# Patient Record
Sex: Male | Born: 1980 | Race: Black or African American | Hispanic: No | Marital: Single | Smoking: Current every day smoker
Health system: Southern US, Community
[De-identification: ages and names within clinical notes are randomized; demographics above are authoritative.]

## PROBLEM LIST (undated history)

## (undated) DIAGNOSIS — I10 Essential (primary) hypertension: Secondary | ICD-10-CM

## (undated) DIAGNOSIS — K219 Gastro-esophageal reflux disease without esophagitis: Secondary | ICD-10-CM

## (undated) DIAGNOSIS — R599 Enlarged lymph nodes, unspecified: Secondary | ICD-10-CM

## (undated) DIAGNOSIS — R519 Headache, unspecified: Secondary | ICD-10-CM

## (undated) DIAGNOSIS — M542 Cervicalgia: Secondary | ICD-10-CM

## (undated) HISTORY — PX: OTHER SURGICAL HISTORY: SHX169

## (undated) HISTORY — DX: Cervicalgia: M54.2

## (undated) HISTORY — DX: Enlarged lymph nodes, unspecified: R59.9

## (undated) HISTORY — PX: SKIN LESION EXCISION: SHX2412

## (undated) HISTORY — DX: Headache, unspecified: R51.9

## (undated) HISTORY — DX: Essential (primary) hypertension: I10

---
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
## 2000-01-15 ENCOUNTER — Encounter: Payer: Self-pay | Admitting: General Practice

## 2000-01-15 ENCOUNTER — Encounter: Admission: RE | Admit: 2000-01-15 | Discharge: 2000-01-15 | Payer: Self-pay | Admitting: General Practice

## 2000-12-21 ENCOUNTER — Observation Stay (HOSPITAL_COMMUNITY): Admission: EM | Admit: 2000-12-21 | Discharge: 2000-12-22 | Payer: Self-pay

## 2004-04-24 ENCOUNTER — Emergency Department (HOSPITAL_COMMUNITY): Admission: EM | Admit: 2004-04-24 | Discharge: 2004-04-24 | Payer: Self-pay | Admitting: Emergency Medicine

## 2005-10-31 ENCOUNTER — Emergency Department (HOSPITAL_COMMUNITY): Admission: EM | Admit: 2005-10-31 | Discharge: 2005-10-31 | Payer: Self-pay | Admitting: Emergency Medicine

## 2006-01-27 ENCOUNTER — Emergency Department (HOSPITAL_COMMUNITY): Admission: EM | Admit: 2006-01-27 | Discharge: 2006-01-27 | Payer: Self-pay | Admitting: Family Medicine

## 2006-02-11 ENCOUNTER — Emergency Department (HOSPITAL_COMMUNITY): Admission: EM | Admit: 2006-02-11 | Discharge: 2006-02-11 | Payer: Self-pay | Admitting: Emergency Medicine

## 2012-06-16 ENCOUNTER — Encounter: Payer: Self-pay | Admitting: Gastroenterology

## 2012-06-16 ENCOUNTER — Encounter (HOSPITAL_COMMUNITY): Payer: Self-pay | Admitting: *Deleted

## 2012-06-16 ENCOUNTER — Emergency Department (INDEPENDENT_AMBULATORY_CARE_PROVIDER_SITE_OTHER)
Admission: EM | Admit: 2012-06-16 | Discharge: 2012-06-16 | Disposition: A | Discharge status: Home / Self Care | Payer: Self-pay | Source: Home / Self Care | Attending: Family Medicine | Admitting: Family Medicine

## 2012-06-16 DIAGNOSIS — R109 Unspecified abdominal pain: Secondary | ICD-10-CM

## 2012-06-16 DIAGNOSIS — K921 Melena: Secondary | ICD-10-CM

## 2012-06-16 LAB — POCT I-STAT, CHEM 8
BUN: 19 mg/dL (ref 6–23)
Calcium, Ion: 1.18 mmol/L (ref 1.12–1.23)
Chloride: 103 mEq/L (ref 96–112)
Creatinine, Ser: 1.3 mg/dL (ref 0.50–1.35)
Glucose, Bld: 86 mg/dL (ref 70–99)
HCT: 48 % (ref 39.0–52.0)
Hemoglobin: 16.3 g/dL (ref 13.0–17.0)
Potassium: 4.1 mEq/L (ref 3.5–5.1)
Sodium: 139 mEq/L (ref 135–145)
TCO2: 27 mmol/L (ref 0–100)

## 2012-06-16 LAB — OCCULT BLOOD, POC DEVICE: Fecal Occult Bld: POSITIVE

## 2012-06-16 MED ORDER — METHYLCELLULOSE (LAXATIVE) PO POWD: 1.0000 | Freq: Every day | ORAL | Status: DC

## 2012-06-16 MED ORDER — DICYCLOMINE HCL 20 MG PO TABS: 20.0000 mg | ORAL_TABLET | Freq: Two times a day (BID) | ORAL | Status: DC | PRN

## 2012-06-16 NOTE — ED Notes (Signed)
Pt  Reports  Rectal  Bleeding  Off  And  On  For  Several  Years  - he  Reports  He  Had    colonoscopys  When he  Was  incaretaed  In past  And  Was told  He  Had  hemmoriods          In past  He   Reports  Bleeding  At  Times  After bm   Both  Bright  And  Dark he  Stated     -  He    Reports some  Bubbling in his  Stomach at  Intervals  As  Well

## 2012-06-16 NOTE — ED Provider Notes (Signed)
History     CSN: [id number]  Arrival date & time 06/16/12  1131   First MD Initiated Contact with Patient 06/16/12 1322      Chief Complaint  Patient presents with  . Rectal Bleeding    (Consider location/radiation/quality/duration/timing/severity/associated sxs/prior treatment) Patient is a 31 y.o. male presenting with hematochezia. The history is provided by the patient.  Rectal Bleeding  Associated symptoms include abdominal pain and diarrhea. Pertinent negatives include no nausea, no rectal pain and no vomiting.  Blood in Stool: Patient presents for presents evaluation of blood in stool/ rectal bleeding. Patient has no associated straining with defecation.  the patient denies n/v/d, no symptoms of dehydration.  The patient has no known history of PUD, liver disease, cirrhosis, IBD, bleeding disorders and no history of taking NSAIDS/Coumadin. The patient reports ongoing problem, noted to be triggered by food intake.  There is not a history of rectal injury/denies anal intercourse. Patient has had similar episodes of rectal bleeding in the past.  Colonoscopy completed January 2013 while in [place] and was told he had hemorrhoids for which rectal suppositories and proctofoam was prescribed.   History reviewed. No pertinent past medical history.  No past surgical history on file.  Family History  Problem Relation Age of Onset  . Irritable bowel syndrome Sister     History  Substance Use Topics  . Smoking status: Not on file  . Smokeless tobacco: Not on file  . Alcohol Use:       Review of Systems  Constitutional: Negative.   Respiratory: Negative.   Cardiovascular: Negative.   Gastrointestinal: Positive for abdominal pain, diarrhea, blood in stool, hematochezia and anal bleeding. Negative for nausea, vomiting, constipation and rectal pain.  Genitourinary: Negative.     Allergies  Review of patient's allergies indicates no known allergies.  Home Medications    Current Outpatient Rx  Name Route Sig Dispense Refill  . DICYCLOMINE HCL 20 MG PO TABS Oral Take 1 tablet (20 mg total) by mouth 2 (two) times daily as needed. 30 tablet 1  . METHYLCELLULOSE (LAXATIVE) PO POWD Oral Take 1 packet by mouth daily. 30 tablet 2    BP 140/81  Pulse 78  Temp 98.6 F (37 C) (Oral)  Resp 18  SpO2 100%  Physical Exam  Nursing note and vitals reviewed. Constitutional: He is oriented to person, place, and time. Vital signs are normal. He appears well-developed and well-nourished. He is active and cooperative.  HENT:  Head: Normocephalic.  Mouth/Throat: Uvula is midline, oropharynx is clear and moist and mucous membranes are normal.  Eyes: Conjunctivae normal are normal. Pupils are equal, round, and reactive to light. No scleral icterus.  Neck: Trachea normal, normal range of motion and full passive range of motion without pain. Neck supple. No spinous process tenderness and no muscular tenderness present. No Brudzinski's sign noted.  Cardiovascular: Normal rate, regular rhythm, normal heart sounds, intact distal pulses and normal pulses.   Pulmonary/Chest: Effort normal and breath sounds normal.  Abdominal: Soft. Normal appearance and bowel sounds are normal. He exhibits no mass. There is no tenderness. There is no rebound and no guarding.  Genitourinary: Prostate normal. Rectal exam shows no external hemorrhoid, no internal hemorrhoid, no fissure, no mass, no tenderness and anal tone normal. Guaiac positive stool.  Lymphadenopathy:       Right: No inguinal adenopathy present.       Left: No inguinal adenopathy present.  Neurological: He is alert and oriented to person, place, and time.  No cranial nerve deficit or sensory deficit.  Skin: Skin is warm and dry.  Psychiatric: He has a normal mood and affect. His speech is normal and behavior is normal. Judgment and thought content normal. Cognition and memory are normal.    ED Course  Procedures (including  critical care time)   Labs Reviewed  OCCULT BLOOD, POC DEVICE  POCT I-STAT, CHEM 8   No results found.   1. Abdominal cramping   2. Blood in stool       MDM  Probable IBS.  Follow up with gastroenterologist for further evaluation and diagnostic studies.       Johnsie Kindred, NP 06/16/12 1428

## 2012-06-17 NOTE — ED Provider Notes (Signed)
Medical screening examination/treatment/procedure(s) were performed by non-physician practitioner and as supervising physician I was immediately available for consultation/collaboration.   [name],Ricardo [name]   Ricardo Crumbley Moreno-Coll, [name] 06/17/12 2128 

## 2012-07-09 ENCOUNTER — Ambulatory Visit (INDEPENDENT_AMBULATORY_CARE_PROVIDER_SITE_OTHER): Payer: Self-pay | Admitting: Gastroenterology

## 2012-07-09 ENCOUNTER — Encounter: Payer: Self-pay | Admitting: Gastroenterology

## 2012-07-09 VITALS — BP 124/82 | HR 67 | Ht 67.0 in | Wt 191.8 lb

## 2012-07-09 DIAGNOSIS — K5120 Ulcerative (chronic) proctitis without complications: Secondary | ICD-10-CM | POA: Insufficient documentation

## 2012-07-09 DIAGNOSIS — K589 Irritable bowel syndrome without diarrhea: Secondary | ICD-10-CM | POA: Insufficient documentation

## 2012-07-09 DIAGNOSIS — K921 Melena: Secondary | ICD-10-CM

## 2012-07-09 MED ORDER — MESALAMINE 1000 MG RE SUPP: 1000.0000 mg | Freq: Every day | RECTAL | Status: DC

## 2012-07-09 MED ORDER — DICYCLOMINE HCL 20 MG PO TABS: ORAL_TABLET | ORAL | Status: DC

## 2012-07-09 NOTE — Patient Instructions (Addendum)
You have been given information on Hemorrhoid/ IBS/ rectal care instructions.  We have sent the following medications to your pharmacy for you to pick up at your convenience: Canasa suppositories to use for 2 weeks, Bentyl to take one tablet by mouth before meals as needed up to four times a day.  Proctitis Proctitis is the swelling and soreness (inflammation) of the lining of the rectum. The rectum is at the end of the large intestine and is attached to the anus. The inflammation causes pain and discomfort. It may be short-term (acute) or long-lasting (chronic). CAUSES Inflammation in the rectum can be caused by many things, such as:  Sexually transmitted diseases (STDs).  Infection.  Anal-rectal trauma or injury.  Ulcerative colitis or Crohn's disease.  Radiation therapy directed near the rectum.  Antibiotic therapy. SYMPTOMS  Sudden, uncomfortable, and frequent urge to have a bowel movement.  Anal or rectal pain.  Abdominal cramping or pain.  Sensation that the rectum is full.  Rectal bleeding.  Pus or mucus discharge from anus.  Diarrhea or frequent soft, loose stools. DIAGNOSIS Diagnosis may include the following:  A history and physical exam.  An STD test.  Blood tests.  Stool tests.  Rectal culture.  A procedure to evaluate the anal canal (anoscopy).  Procedures to look at part, or the entire large bowel (sigmoidoscopy, colonoscopy). TREATMENT Treatment of proctitis depends on the cause. Reducing the symptoms of inflammation and eliminating infection are the main goals of treatment. Treatment may include:  Home remedies and lifestyle, such as sitz baths and avoiding food right before bedtime.  Topical ointments, foams, suppositories, or enemas, such as corticosteroids or anti-inflammatories.  Antibiotic or antiviral medicines to treat infection or to control harmful bacteria.  Medicines to control diarrhea, soften stools, and reduce  pain.  Medicines to suppress the immune system.  Avoiding the activity that caused rectal trauma.  Nutritional, dietary, or herbal supplements.  Heat or laser therapy for persistent bleeding.  A dilation procedure to enlarge a narrowed rectum.  Surgery, though rare, may be necessary to repair damaged rectal lining. HOME CARE INSTRUCTIONS Only take medicines that are recommended or approved by your caregiver.Do not take anti-diarrhea medicine without your caregiver's approval. SEEK MEDICAL CARE IF:  You often experience one or more of the symptoms noted above.  You keep experiencing symptoms after treatment.  You have questions or concerns about your symptoms or treatment plan. MAKE SURE YOU:  Understand these instructions.  Will watch your condition.  Will get help right away if you are not doing well or get worse. FOR MORE INFORMATION National Institute of Diabetes and Digestive and Kidney Disease (NIDDK): www.digestive.https://bradley.com/ Document Released: 08/30/2011 Document Revised: 12/03/2011 Document Reviewed: 08/30/2011 [facility] Patient Information 2013 [place], [place].

## 2012-07-09 NOTE — Progress Notes (Signed)
History of Present Illness: This is a 31 year old male who relates persistent problems with rectal bleeding and mucus per rectum. He has occasional episodes of crampy abdominal pain with urgent loose stools. The symptoms occur intermittently. He was incarcerated in [place] for  6-1/2 years. He underwent colonoscopy at [facility] by Dr. Woody Seller on 2 occasions for evaluation of his symptoms. The first was in March 2012 which showed only internal hemorrhoids and the second was in January 2013 which showed moderately severe proctitis and internal hemorrhoids. He states ProctoFoam was initially prescribed and could not be used in prison and therefore he was prescribed a suppository which was not effective and he discontinued all medications. His weight is stable his appetite is good. Denies weight loss, constipation, change in stool caliber, melena, nausea, vomiting, dysphagia, reflux symptoms, chest pain.  Review of Systems: Pertinent positive and negative review of systems were noted in the above HPI section. All other review of systems were otherwise negative.  Current Medications, Allergies, Past Medical History, Past Surgical History, Family History and Social History were reviewed in [employer] record.  Physical Exam: General: Well developed , well nourished, no acute distress Head: Normocephalic and atraumatic Eyes:  sclerae anicteric, EOMI Ears: Normal auditory acuity Mouth: No deformity or lesions Neck: Supple, no masses or thyromegaly Lungs: Clear throughout to auscultation Heart: Regular rate and rhythm; no murmurs, rubs or bruits Abdomen: Soft, non tender and non distended. No masses, hepatosplenomegaly or hernias noted. Normal Bowel sounds Rectal: no internal or external lesions, trace heme + brown stool  Musculoskeletal: Symmetrical with no gross deformities  Skin: No lesions on visible extremities Pulses:  Normal pulses noted Extremities: No  clubbing, cyanosis, edema or deformities noted Neurological: Alert oriented x 4, grossly nonfocal Cervical Nodes:  No significant cervical adenopathy Inguinal Nodes: No significant inguinal adenopathy Psychological:  Alert and cooperative. Normal mood and affect  Assessment and Recommendations:  1. Proctitis and internal hemorrhoids. His symptoms are typical for proctitis and we will begin Canasa 1000 mg suppositories at bedtime. If his symptoms do not respond consider increasing to twice a day or trying a hydrocortisone enema or a 5-ASA enema. Also consider an oral 5 ASA or Uceris.  2. Irritable bowel syndrome. Has intermittent crampy abdominal pain and urgent diarrhea seems typical for IBS. Advised to use Bentyl 4 times a day taken before meals as needed when his symptoms are active.

## 2012-07-11 ENCOUNTER — Telehealth: Payer: Self-pay | Admitting: Gastroenterology

## 2012-07-11 NOTE — Telephone Encounter (Signed)
Told patient we called him yesterday to see if he also has the biopsy report from the latest Colonoscopy on 09/28/11. Pt looked through his papers and has the report and will bring it to me today. Also patient states that his copay for Canasa suppositories is too expensive and he can afford them and wants to know if we have any more samples. Told him I can leave some more samples out front for him to pick up and he can leave the report for me up front. Pt agreed and will come by today.

## 2012-07-28 ENCOUNTER — Telehealth: Payer: Self-pay | Admitting: Gastroenterology

## 2012-07-28 NOTE — Telephone Encounter (Signed)
Patient needs several more weeks and he cannot afford the prescription with no insurance. Told him we are out of samples in the office but I will call the drug rep to get some more samples. Called Greig Castilla from [employer] and he states he is coming by tomorrow to bring some more samples. Told patient to come by tomorrow afternoon and I will leave some samples up front for patient to pick up. Pt agreed and verbalized understanding.

## 2013-07-01 ENCOUNTER — Telehealth: Payer: Self-pay | Admitting: Gastroenterology

## 2013-07-01 NOTE — Telephone Encounter (Signed)
Informed patient that we do not have any samples of suppositories at this time but to call back and check to see if we have any in a couple of weeks.

## 2014-12-27 ENCOUNTER — Encounter: Payer: No Typology Code available for payment source | Admitting: Family Medicine

## 2014-12-28 ENCOUNTER — Ambulatory Visit (INDEPENDENT_AMBULATORY_CARE_PROVIDER_SITE_OTHER): Payer: No Typology Code available for payment source | Admitting: Internal Medicine

## 2014-12-28 VITALS — BP 120/80 | HR 80 | Temp 98.1°F | Resp 16 | Ht 68.0 in | Wt 199.0 lb

## 2014-12-28 DIAGNOSIS — R197 Diarrhea, unspecified: Secondary | ICD-10-CM | POA: Diagnosis not present

## 2014-12-28 DIAGNOSIS — Z0000 Encounter for general adult medical examination without abnormal findings: Secondary | ICD-10-CM

## 2014-12-28 DIAGNOSIS — K921 Melena: Secondary | ICD-10-CM | POA: Diagnosis not present

## 2014-12-28 DIAGNOSIS — D500 Iron deficiency anemia secondary to blood loss (chronic): Secondary | ICD-10-CM

## 2014-12-28 DIAGNOSIS — Q892 Congenital malformations of other endocrine glands: Secondary | ICD-10-CM

## 2014-12-28 LAB — TSH: TSH: 1.131 u[IU]/mL (ref 0.350–4.500)

## 2014-12-28 LAB — COMPREHENSIVE METABOLIC PANEL
ALK PHOS: 51 U/L (ref 39–117)
ALT: 16 U/L (ref 0–53)
AST: 23 U/L (ref 0–37)
Albumin: 4 g/dL (ref 3.5–5.2)
BILIRUBIN TOTAL: 0.5 mg/dL (ref 0.2–1.2)
BUN: 16 mg/dL (ref 6–23)
CO2: 24 mEq/L (ref 19–32)
CREATININE: 1.19 mg/dL (ref 0.50–1.35)
Calcium: 9.1 mg/dL (ref 8.4–10.5)
Chloride: 104 mEq/L (ref 96–112)
GLUCOSE: 86 mg/dL (ref 70–99)
Potassium: 4.4 mEq/L (ref 3.5–5.3)
SODIUM: 136 meq/L (ref 135–145)
Total Protein: 7.3 g/dL (ref 6.0–8.3)

## 2014-12-28 LAB — POCT CBC
Granulocyte percent: 41.4 %G (ref 37–80)
HEMATOCRIT: 33.8 % — AB (ref 43.5–53.7)
HEMOGLOBIN: 10.5 g/dL — AB (ref 14.1–18.1)
LYMPH, POC: 1.9 (ref 0.6–3.4)
MCH: 21.7 pg — AB (ref 27–31.2)
MCHC: 30.9 g/dL — AB (ref 31.8–35.4)
MCV: 70.2 fL — AB (ref 80–97)
MID (cbc): 0.4 (ref 0–0.9)
MPV: 6.5 fL (ref 0–99.8)
POC Granulocyte: 1.7 — AB (ref 2–6.9)
POC LYMPH PERCENT: 47.6 %L (ref 10–50)
POC MID %: 11 %M (ref 0–12)
Platelet Count, POC: 376 10*3/uL (ref 142–424)
RBC: 4.82 M/uL (ref 4.69–6.13)
RDW, POC: 19.5 %
WBC: 4 10*3/uL — AB (ref 4.6–10.2)

## 2014-12-28 LAB — IRON AND TIBC
%SAT: 4 % — ABNORMAL LOW (ref 20–55)
Iron: 16 ug/dL — ABNORMAL LOW (ref 42–165)
TIBC: 435 ug/dL (ref 215–435)
UIBC: 419 ug/dL — AB (ref 125–400)

## 2014-12-28 LAB — LIPID PANEL
CHOLESTEROL: 159 mg/dL (ref 0–200)
HDL: 54 mg/dL (ref >=40)
LDL Cholesterol: 96 mg/dL (ref 0–99)
TRIGLYCERIDES: 44 mg/dL (ref <150)
Total CHOL/HDL Ratio: 2.9 Ratio
VLDL: 9 mg/dL (ref 0–40)

## 2014-12-28 LAB — POCT SEDIMENTATION RATE: POCT SED RATE: 27 mm/hr — AB (ref 0–22)

## 2014-12-28 MED ORDER — DICYCLOMINE HCL 20 MG PO TABS: 20.0000 mg | ORAL_TABLET | Freq: Three times a day (TID) | ORAL | Status: DC

## 2014-12-28 NOTE — Progress Notes (Addendum)
Subjective:  This chart was scribed for Tonye Pearsonobert P Ceili Boshers, MD by Charline BillsEssence Howell, ED Scribe. The patient was seen in room 2. Patient's care was started at 8:44 AM.   Patient ID: Ricardo [name], male    DOB: Jul 03, 1981, 34 y.o.   MRN: [id number]  Chief Complaint  Patient presents with  . Annual Exam   HPI HPI Comments: Ricardo [name] is a 34 y.o. male who presents to the [facility] for an annual exam. Pt states that he is healthy other than GI symptoms such as diarrhea and blood in stools that he has been followed by GI for. Pt was advised to treat with superiorities which he states has provided no relief. Pt reports 5-6 episodes of diarrhea daily, after eating and upon waking in the mornings. He states that he has had 2 colonoscopies done while incarcerated in [place] 4 years ago but no records have been obtained. Pt has not had an abdominal CT. He denies fever, night sweats, weight change, loss of appetite, abdominal pain, constipation, arthralgias other than knee pain while exercising, leg swelling, joint swelling, issues with stamina while working out, dysuria, urinary frequency, increased stress.   ENT Pt also presents with intermittent swollen lymph nodes near his L neck for many years. Pt reports that he [facility] many years [facility] for symptoms. He has been treating with Keflex and Prednisone which he is currently out of.   Immunizations Pt reports that his last tetanus was 3-4 years ago.  History reviewed. No pertinent past medical history. No current outpatient prescriptions on file prior to visit.   No current facility-administered medications on file prior to visit.   No Known Allergies Review of Systems  Constitutional: Negative for fever, diaphoresis, appetite change and unexpected weight change.  Cardiovascular: Negative for leg swelling.  Gastrointestinal: Positive for diarrhea and blood in stool. Negative for abdominal pain and  constipation.  Genitourinary: Negative for dysuria and frequency.  Musculoskeletal: Positive for arthralgias. Negative for joint swelling.  BP 120/80 mmHg  Pulse 80  Temp(Src) 98.1 F (36.7 C) (Oral)  Resp 16  Ht 5\' 8"  (1.727 m)  Wt 199 lb (90.266 kg)  BMI 30.26 kg/m2  SpO2 98%    Objective:   Physical Exam  Constitutional: He is oriented to person, place, and time. He appears well-developed and well-nourished.  HENT:  Head: Normocephalic and atraumatic.    Right Ear: Hearing, tympanic membrane, external ear and ear canal normal.  Left Ear: Hearing, tympanic membrane, external ear and ear canal normal.  Nose: Nose normal.  Mouth/Throat: Uvula is midline, oropharynx is clear and moist and mucous membranes are normal.  Eyes: Conjunctivae, EOM and lids are normal. Pupils are equal, round, and reactive to light. Right eye exhibits no discharge. Left eye exhibits no discharge. No scleral icterus.  Neck: Trachea normal and normal range of motion. Neck supple. Carotid bruit is not present.  Cardiovascular: Normal rate, regular rhythm, normal heart sounds, intact distal pulses and normal pulses.   No murmur heard. Pulmonary/Chest: Effort normal and breath sounds normal. No respiratory distress. He has no wheezes. He has no rhonchi. He has no rales.  Abdominal: Soft. Normal appearance and bowel sounds are normal. He exhibits no abdominal bruit. There is no tenderness.  Musculoskeletal: Normal range of motion. He exhibits no edema or tenderness.  Lymphadenopathy:       Head (right side): No submental, no submandibular, no tonsillar, no preauricular, no posterior  auricular and no occipital adenopathy present.       Head (left side): No submental, no submandibular, no tonsillar, no preauricular, no posterior auricular and no occipital adenopathy present.    He has no cervical adenopathy.  Neurological: He is alert and oriented to person, place, and time. He has normal strength and normal  reflexes. No cranial nerve deficit or sensory deficit. Coordination and gait normal.  Skin: Skin is warm, dry and intact. No lesion and no rash noted.  Psychiatric: He has a normal mood and affect. His speech is normal and behavior is normal. Judgment and thought content normal.    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 dicyclomine (BENTYL) 20 MG tablet    Sig: Take 1 tablet (20 mg total) by mouth 4 (four) times daily -  before meals and at bedtime.    Dispense:  30 tablet    Refill:  3    I have completed the patient encounter in its entirety as documented by the scribe, with editing by me where necessary. Jiro Kiester P. Merla Riches, M.D.

## 2014-12-30 ENCOUNTER — Encounter: Payer: Self-pay | Admitting: Internal Medicine

## 2015-02-03 ENCOUNTER — Other Ambulatory Visit: Payer: Self-pay | Admitting: Otolaryngology

## 2015-02-03 DIAGNOSIS — K1122 Acute recurrent sialoadenitis: Secondary | ICD-10-CM

## 2015-09-07 NOTE — Progress Notes (Signed)
This encounter was created in error - please disregard.

## 2019-04-07 ENCOUNTER — Other Ambulatory Visit: Payer: Self-pay

## 2019-04-07 ENCOUNTER — Emergency Department (HOSPITAL_COMMUNITY)
Admission: EM | Admit: 2019-04-07 | Discharge: 2019-04-07 | Disposition: A | Discharge status: Home / Self Care | Payer: No Typology Code available for payment source | Attending: Emergency Medicine | Admitting: Emergency Medicine

## 2019-04-07 ENCOUNTER — Emergency Department (HOSPITAL_COMMUNITY): Payer: No Typology Code available for payment source

## 2019-04-07 ENCOUNTER — Encounter (HOSPITAL_COMMUNITY): Payer: Self-pay

## 2019-04-07 DIAGNOSIS — Y998 Other external cause status: Secondary | ICD-10-CM | POA: Insufficient documentation

## 2019-04-07 DIAGNOSIS — Y9389 Activity, other specified: Secondary | ICD-10-CM | POA: Insufficient documentation

## 2019-04-07 DIAGNOSIS — M545 Low back pain, unspecified: Secondary | ICD-10-CM

## 2019-04-07 DIAGNOSIS — Z87891 Personal history of nicotine dependence: Secondary | ICD-10-CM | POA: Diagnosis not present

## 2019-04-07 DIAGNOSIS — Y92481 Parking lot as the place of occurrence of the external cause: Secondary | ICD-10-CM | POA: Diagnosis not present

## 2019-04-07 MED ORDER — METHOCARBAMOL 500 MG PO TABS: 500.0000 mg | ORAL_TABLET | Freq: Two times a day (BID) | ORAL | 0 refills | Status: DC

## 2019-04-07 NOTE — ED Notes (Signed)
Patient transported to X-ray 
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
## 2019-04-07 NOTE — Discharge Instructions (Signed)
Follow-up with orthopedics for your back pain.  I have given you a muscle relaxer.  Please take as prescribed.  Do not drive or operate heavy machinery while taking this medicine.  You may also take ibuprofen and Tylenol.  Return to the ED for any new worsening symptoms.
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
## 2019-06-13 ENCOUNTER — Other Ambulatory Visit: Payer: Self-pay

## 2019-06-13 ENCOUNTER — Encounter (HOSPITAL_COMMUNITY): Payer: Self-pay | Admitting: *Deleted

## 2019-06-13 ENCOUNTER — Emergency Department (HOSPITAL_COMMUNITY)
Admission: EM | Admit: 2019-06-13 | Discharge: 2019-06-14 | Disposition: A | Discharge status: Home / Self Care | Payer: No Typology Code available for payment source | Attending: Emergency Medicine | Admitting: Emergency Medicine

## 2019-06-13 DIAGNOSIS — Z79899 Other long term (current) drug therapy: Secondary | ICD-10-CM | POA: Diagnosis not present

## 2019-06-13 DIAGNOSIS — M62838 Other muscle spasm: Secondary | ICD-10-CM | POA: Diagnosis not present

## 2019-06-13 DIAGNOSIS — M25512 Pain in left shoulder: Secondary | ICD-10-CM | POA: Diagnosis present

## 2019-06-13 DIAGNOSIS — Z87891 Personal history of nicotine dependence: Secondary | ICD-10-CM | POA: Insufficient documentation

## 2019-06-13 NOTE — ED Triage Notes (Signed)
The pt is c/o lt shoulder and lt arm pain for 3-4 days  No injury  Pain worse with movement

## 2019-06-14 ENCOUNTER — Emergency Department (HOSPITAL_COMMUNITY): Payer: No Typology Code available for payment source

## 2019-06-14 MED ORDER — IBUPROFEN 600 MG PO TABS: 600.0000 mg | ORAL_TABLET | Freq: Four times a day (QID) | ORAL | 0 refills | Status: DC | PRN

## 2019-06-14 MED ORDER — CYCLOBENZAPRINE HCL 5 MG PO TABS: 5.0000 mg | ORAL_TABLET | Freq: Three times a day (TID) | ORAL | 0 refills | Status: DC | PRN

## 2019-06-14 MED ORDER — DIAZEPAM 2 MG PO TABS
2.0000 mg | ORAL_TABLET | Freq: Once | ORAL | Status: AC
  Administered 2019-06-14: 2 mg via ORAL
  Filled 2019-06-14: qty 1

## 2019-06-14 MED ORDER — OXYCODONE-ACETAMINOPHEN 5-325 MG PO TABS
1.0000 | ORAL_TABLET | Freq: Once | ORAL | Status: AC
  Administered 2019-06-14: 1 via ORAL
  Filled 2019-06-14: qty 1

## 2019-06-14 MED ORDER — KETOROLAC TROMETHAMINE 60 MG/2ML IM SOLN
30.0000 mg | Freq: Once | INTRAMUSCULAR | Status: AC
  Administered 2019-06-14: 09:00:00 30 mg via INTRAMUSCULAR
  Filled 2019-06-14: qty 2

## 2019-06-14 MED ORDER — KETOROLAC TROMETHAMINE 15 MG/ML IJ SOLN: 15.0000 mg | Freq: Once | INTRAMUSCULAR | Status: DC

## 2019-06-14 NOTE — ED Notes (Signed)
Called ortho for shoulder sling.

## 2019-06-14 NOTE — ED Notes (Signed)
Pt stepping outside.  

## 2019-06-14 NOTE — Discharge Instructions (Signed)
Please take the pain and muscle relaxer medicines as prescribed.  Please note the Flexeril can make you somewhat drowsy and cannot be taken while driving parenchyma machinery.  If you develop swelling, worsening pain, falls, fevers, please return to ER for recheck.  Recommend following up with Ortho for your shoulder.  You may use sling for comfort however I would caution that this should not be done all the time and should be limited.  If you leave shoulder in sling the long-term he can develop frozen shoulder and cause more problems.  Recommend range of motion exercises as discussed.
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
## 2019-06-14 NOTE — ED Notes (Signed)
Patient transported to X-ray 

## 2019-06-19 ENCOUNTER — Ambulatory Visit (HOSPITAL_COMMUNITY)
Admission: EM | Admit: 2019-06-19 | Discharge: 2019-06-19 | Disposition: A | Discharge status: Home / Self Care | Payer: Self-pay | Attending: Emergency Medicine | Admitting: Emergency Medicine

## 2019-06-19 ENCOUNTER — Other Ambulatory Visit: Payer: Self-pay

## 2019-06-19 ENCOUNTER — Encounter (HOSPITAL_COMMUNITY): Payer: Self-pay | Admitting: Emergency Medicine

## 2019-06-19 DIAGNOSIS — R030 Elevated blood-pressure reading, without diagnosis of hypertension: Secondary | ICD-10-CM

## 2019-06-19 DIAGNOSIS — M25512 Pain in left shoulder: Secondary | ICD-10-CM

## 2019-06-19 MED ORDER — KETOROLAC TROMETHAMINE 30 MG/ML IJ SOLN
INTRAMUSCULAR | Status: AC
  Filled 2019-06-19: qty 1

## 2019-06-19 MED ORDER — KETOROLAC TROMETHAMINE 30 MG/ML IJ SOLN
30.0000 mg | Freq: Once | INTRAMUSCULAR | Status: AC
  Administered 2019-06-19: 30 mg via INTRAMUSCULAR
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
## 2019-06-19 NOTE — Discharge Instructions (Addendum)
You were given an injection of a pain medication called Toradol today.    Call to schedule an appointment with the orthopedist listed below or the orthopedist you have previously seen or an orthopedist of your choice.    Your blood pressure is elevated today at 153/103.  Please have this rechecked by your primary care provider in 2 weeks.  If you do not have a primary care provider, one is suggested below.       Return here or go to the emergency department if you have acute worsening pain, numbness, weakness, tingling in your fingers hand or arm.

## 2019-07-01 ENCOUNTER — Other Ambulatory Visit: Payer: Self-pay | Admitting: Orthopedic Surgery

## 2019-07-01 DIAGNOSIS — M25512 Pain in left shoulder: Secondary | ICD-10-CM

## 2019-07-04 ENCOUNTER — Other Ambulatory Visit: Payer: Self-pay

## 2019-07-04 ENCOUNTER — Ambulatory Visit
Admission: RE | Admit: 2019-07-04 | Discharge: 2019-07-04 | Disposition: A | Discharge status: Home / Self Care | Payer: Self-pay | Source: Ambulatory Visit | Attending: Orthopedic Surgery | Admitting: Orthopedic Surgery

## 2019-07-04 DIAGNOSIS — M25512 Pain in left shoulder: Secondary | ICD-10-CM

## 2019-08-04 DIAGNOSIS — K115 Sialolithiasis: Secondary | ICD-10-CM | POA: Insufficient documentation

## 2019-08-04 DIAGNOSIS — R221 Localized swelling, mass and lump, neck: Secondary | ICD-10-CM | POA: Insufficient documentation

## 2019-09-22 ENCOUNTER — Other Ambulatory Visit: Payer: Self-pay

## 2019-09-22 ENCOUNTER — Ambulatory Visit (HOSPITAL_COMMUNITY): Admit: 2019-09-22 | Discharge: 2019-09-22 | Discharge status: Absent without leave | Payer: Self-pay

## 2019-09-23 ENCOUNTER — Ambulatory Visit (HOSPITAL_COMMUNITY)
Admission: EM | Admit: 2019-09-23 | Discharge: 2019-09-23 | Disposition: A | Discharge status: Home / Self Care | Payer: Self-pay | Attending: Urgent Care | Admitting: Urgent Care

## 2019-09-23 ENCOUNTER — Other Ambulatory Visit: Payer: Self-pay

## 2019-09-23 ENCOUNTER — Encounter (HOSPITAL_COMMUNITY): Payer: Self-pay

## 2019-09-23 DIAGNOSIS — L259 Unspecified contact dermatitis, unspecified cause: Secondary | ICD-10-CM

## 2019-09-23 DIAGNOSIS — N489 Disorder of penis, unspecified: Secondary | ICD-10-CM

## 2019-09-23 MED ORDER — TRIAMCINOLONE ACETONIDE 0.1 % EX CREA: 1.0000 "application " | TOPICAL_CREAM | Freq: Two times a day (BID) | CUTANEOUS | 0 refills | Status: DC

## 2019-09-23 NOTE — ED Provider Notes (Signed)
[facility]   MRN: [id number] DOB: 13-Jun-1981  Subjective:   Ricardo [name] is a 38 y.o. male presenting for 1 week history of irritation, stinging about the head of the penis on the left side.  Patient states that this happened after he had vigorous sex for about 2 hours using a condom and having oral sex.  States that he has tried Neosporin, jock itch which worsened the pain.  Denies history of HSV.  Denies wanting any kind of STI testing including HSV testing.  No current facility-administered medications for this encounter.  Current Outpatient Medications:  .  cyclobenzaprine (FLEXERIL) 5 MG tablet, Take 1 tablet (5 mg total) by mouth 3 (three) times daily as needed for muscle spasms., Disp: 20 tablet, Rfl: 0 .  dicyclomine (BENTYL) 20 MG tablet, Take 1 tablet (20 mg total) by mouth 4 (four) times daily -  before meals and at bedtime., Disp: 30 tablet, Rfl: 3 .  ibuprofen (ADVIL) 600 MG tablet, Take 1 tablet (600 mg total) by mouth every 6 (six) hours as needed., Disp: 30 tablet, Rfl: 0 .  methocarbamol (ROBAXIN) 500 MG tablet, Take 1 tablet (500 mg total) by mouth 2 (two) times daily., Disp: 20 tablet, Rfl: 0   No Known Allergies  History reviewed. No pertinent past medical history.   History reviewed. No pertinent surgical history.  Family History  Problem Relation Age of Onset  . Irritable bowel syndrome Sister   . Diabetes Sister   . Hyperlipidemia Sister   . Hypertension Sister   . Hypertension Mother   . Hypertension Father   . Hyperlipidemia Father     Social History   Tobacco Use  . Smoking status: Former Games developer  . Smokeless tobacco: Never Used  Substance Use Topics  . Alcohol use: No    Alcohol/week: 0.0 standard drinks  . Drug use: No    ROS   Objective:   Vitals: BP (!) 147/107 (BP Location: Right Arm)   Pulse 91   Temp 98.4 F (36.9 C) (Oral)   Resp 18   SpO2 100%   Physical Exam Constitutional:      General: He is not in acute  distress.    Appearance: Normal appearance. He is well-developed and normal weight. He is not ill-appearing, toxic-appearing or diaphoretic.  HENT:     Head: Normocephalic and atraumatic.     Right Ear: External ear normal.     Left Ear: External ear normal.     Nose: Nose normal.     Mouth/Throat:     Pharynx: Oropharynx is clear.  Eyes:     General: No scleral icterus.       Right eye: No discharge.        Left eye: No discharge.     Extraocular Movements: Extraocular movements intact.     Pupils: Pupils are equal, round, and reactive to light.  Cardiovascular:     Rate and Rhythm: Normal rate.  Pulmonary:     Effort: Pulmonary effort is normal.  Genitourinary:   Musculoskeletal:     Cervical back: Normal range of motion.  Neurological:     Mental Status: He is alert and oriented to person, place, and time.  Psychiatric:        Mood and Affect: Mood normal.        Behavior: Behavior normal.        Thought Content: Thought content normal.        Judgment: Judgment normal.  Assessment and Plan :   1. Penile abnormality   2. Contact dermatitis, unspecified contact dermatitis type, unspecified trigger     Will manage for a contact dermatitis related to condom use, oral sex and prolonged sexual activity.  Counseled patient on appropriate use of triamcinolone.  He refused STI testing including HSV testing. Counseled patient on potential for adverse effects with medications prescribed/recommended today, ER and return-to-clinic precautions discussed, patient verbalized understanding.    Jaynee Eagles, PA-C 09/23/19 1321

## 2019-09-23 NOTE — ED Triage Notes (Signed)
Pt presents with irritation at the tip of his penis X 2 weeks.

## 2019-12-22 ENCOUNTER — Other Ambulatory Visit: Payer: Self-pay

## 2019-12-22 ENCOUNTER — Encounter (HOSPITAL_COMMUNITY): Payer: Self-pay

## 2019-12-22 ENCOUNTER — Ambulatory Visit (HOSPITAL_COMMUNITY)
Admission: EM | Admit: 2019-12-22 | Discharge: 2019-12-22 | Disposition: A | Discharge status: Home / Self Care | Payer: 59 | Attending: Internal Medicine | Admitting: Internal Medicine

## 2019-12-22 DIAGNOSIS — M5432 Sciatica, left side: Secondary | ICD-10-CM | POA: Diagnosis not present

## 2019-12-22 MED ORDER — KETOROLAC TROMETHAMINE 60 MG/2ML IM SOLN
60.0000 mg | Freq: Once | INTRAMUSCULAR | Status: AC
  Administered 2019-12-22: 60 mg via INTRAMUSCULAR

## 2019-12-22 MED ORDER — CYCLOBENZAPRINE HCL 5 MG PO TABS: 5.0000 mg | ORAL_TABLET | Freq: Three times a day (TID) | ORAL | 0 refills | Status: DC | PRN

## 2019-12-22 MED ORDER — PREDNISONE 20 MG PO TABS
20.0000 mg | ORAL_TABLET | Freq: Every day | ORAL | 0 refills | Status: AC
Start: 2019-12-22 — End: 2019-12-27

## 2019-12-22 MED ORDER — IBUPROFEN 600 MG PO TABS: 600.0000 mg | ORAL_TABLET | Freq: Four times a day (QID) | ORAL | 0 refills | Status: DC | PRN

## 2019-12-22 MED ORDER — KETOROLAC TROMETHAMINE 60 MG/2ML IM SOLN
INTRAMUSCULAR | Status: AC
  Filled 2019-12-22: qty 2

## 2019-12-22 NOTE — ED Triage Notes (Signed)
Pt presents with recurrent left hip pain ; pt believes it was aggravated by lifting at work.

## 2019-12-23 NOTE — ED Provider Notes (Signed)
[facility]    CSN: [id number] Arrival date & time: 12/22/19  0815      History   Chief Complaint Chief Complaint  Patient presents with  . Hip Pain    HPI Ricardo [name] is a 39 y.o. male comes to the urgent care if sided back pain which radiates to the left leg.  Symptoms are recurrent in nature.  It is currently of moderate severity.  Radiates to the left leg.  It is aggravated by movement.  He has not tried any over-the-counter medications.  No numbness or tingling.  Previously patient benefited from IM pain medications.  No trauma or falls.  His job requires repetitive heavy lifting.   HPI  History reviewed. No pertinent past medical history.  Patient Active Problem List   Diagnosis Date Noted  . 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 Irritable bowel syndrome 07/09/2012    History reviewed. No pertinent surgical history.     Home Medications    Prior to Admission medications   Medication Sig Start Date End Date Taking? Authorizing Provider  cyclobenzaprine (FLEXERIL) 5 MG tablet Take 1 tablet (5 mg total) by mouth 3 (three) times daily as needed for muscle spasms. 12/22/19   Williette Loewe, Myrene Galas, MD  dicyclomine (BENTYL) 20 MG tablet Take 1 tablet (20 mg total) by mouth 4 (four) times daily -  before meals and at bedtime. 12/28/14   Leandrew Koyanagi, MD  ibuprofen (ADVIL) 600 MG tablet Take 1 tablet (600 mg total) by mouth every 6 (six) hours as needed. 12/22/19   Jovanna Hodges, Myrene Galas, MD  predniSONE (DELTASONE) 20 MG tablet Take 1 tablet (20 mg total) by mouth daily for 5 days. 12/22/19 12/27/19  Chase Picket, MD  triamcinolone cream (KENALOG) 0.1 % Apply 1 application topically 2 (two) times daily. 09/23/19   Jaynee Eagles, PA-C    Family History Family History  Problem Relation Age of Onset  . Irritable bowel syndrome Sister   . Diabetes Sister   . Hyperlipidemia Sister   . Hypertension Sister   . Hypertension Mother   . Hypertension Father   .  Hyperlipidemia Father     Social History Social History   Tobacco Use  . Smoking status: Former Research scientist (life sciences)  . Smokeless tobacco: Never Used  Substance Use Topics  . Alcohol use: No    Alcohol/week: 0.0 standard drinks  . Drug use: No     Allergies   Patient has no known allergies.   Review of Systems Review of Systems  Constitutional: Negative for activity change, chills, fatigue and fever.  Gastrointestinal: Negative.   Genitourinary: Negative.  Negative for difficulty urinating.  Musculoskeletal: Positive for arthralgias and back pain. Negative for gait problem and joint swelling.  Skin: Negative.   Neurological: Negative for dizziness, light-headedness and headaches.  Psychiatric/Behavioral: Negative for confusion and decreased concentration.     Physical Exam Triage Vital Signs ED Triage Vitals  Enc Vitals Group     BP 12/22/19 0826 (!) 189/113     Pulse Rate 12/22/19 0826 89     Resp 12/22/19 0826 18     Temp 12/22/19 0826 98.6 F (37 C)     Temp Source 12/22/19 0826 Oral     SpO2 12/22/19 0826 99 %     Weight --      Height --      Head Circumference --      Peak Flow --      Pain Score 12/22/19  0254 7     Pain Loc --      Pain Edu? --      Excl. in GC? --    No data found.  Updated Vital Signs BP (!) 189/113 (BP Location: Left Arm)   Pulse 89   Temp 98.6 F (37 C) (Oral)   Resp 18   SpO2 99%   Visual Acuity Right Eye Distance:   Left Eye Distance:   Bilateral Distance:    Right Eye Near:   Left Eye Near:    Bilateral Near:     Physical Exam Vitals and nursing note reviewed.  Constitutional:      General: He is in acute distress.     Appearance: Normal appearance. He is not ill-appearing.  Cardiovascular:     Rate and Rhythm: Normal rate and regular rhythm.  Pulmonary:     Effort: Pulmonary effort is normal.     Breath sounds: Normal breath sounds.  Abdominal:     General: Bowel sounds are normal.     Palpations: Abdomen is soft.    Musculoskeletal:        General: Tenderness present. No swelling or deformity. Normal range of motion.     Comments: Straight leg test is remarkable for the left leg   Skin:    General: Skin is warm.     Capillary Refill: Capillary refill takes less than 2 seconds.  Neurological:     General: No focal deficit present.     Mental Status: He is alert.      UC Treatments / Results  Labs (all labs ordered are listed, but only abnormal results are displayed) Labs Reviewed - No data to display  EKG   Radiology No results found.  Procedures Procedures (including critical care time)  Medications Ordered in UC Medications  ketorolac (TORADOL) injection 60 mg (60 mg Intramuscular Given 12/22/19 0906)    Initial Impression / Assessment and Plan / UC Course  I have reviewed the triage vital signs and the nursing notes.  Pertinent labs & imaging results that were available during my care of the patient were reviewed by me and considered in my medical decision making (see chart for details).     1.  Sciatica of the left side: Toradol 30 mg IM x1 dose Ibuprofen 600 3 times daily as needed  Flexeril 5 mg every 6 hours as needed Back strengthening exercises Back injury prevention medication Return precautions given Final Clinical Impressions(s) / UC Diagnoses   Final diagnoses:  Sciatica of left side   Discharge Instructions   None    ED Prescriptions    Medication Sig Dispense Auth. Provider   cyclobenzaprine (FLEXERIL) 5 MG tablet Take 1 tablet (5 mg total) by mouth 3 (three) times daily as needed for muscle spasms. 20 tablet Anthony Tamburo, Britta Mccreedy, MD   ibuprofen (ADVIL) 600 MG tablet Take 1 tablet (600 mg total) by mouth every 6 (six) hours as needed. 30 tablet Annise Boran, Britta Mccreedy, MD   predniSONE (DELTASONE) 20 MG tablet Take 1 tablet (20 mg total) by mouth daily for 5 days. 5 tablet Deondra Wigger, Britta Mccreedy, MD     PDMP not reviewed this encounter.   Merrilee Jansky,  MD 12/23/19 [phone]

## 2020-01-27 ENCOUNTER — Ambulatory Visit (HOSPITAL_COMMUNITY)
Admission: EM | Admit: 2020-01-27 | Discharge: 2020-01-27 | Disposition: A | Discharge status: Home / Self Care | Payer: 59 | Attending: Urgent Care | Admitting: Urgent Care

## 2020-01-27 ENCOUNTER — Encounter (HOSPITAL_COMMUNITY): Payer: Self-pay

## 2020-01-27 ENCOUNTER — Other Ambulatory Visit: Payer: Self-pay

## 2020-01-27 DIAGNOSIS — S46812A Strain of other muscles, fascia and tendons at shoulder and upper arm level, left arm, initial encounter: Secondary | ICD-10-CM

## 2020-01-27 DIAGNOSIS — S161XXA Strain of muscle, fascia and tendon at neck level, initial encounter: Secondary | ICD-10-CM

## 2020-01-27 DIAGNOSIS — M25512 Pain in left shoulder: Secondary | ICD-10-CM

## 2020-01-27 DIAGNOSIS — R030 Elevated blood-pressure reading, without diagnosis of hypertension: Secondary | ICD-10-CM

## 2020-01-27 DIAGNOSIS — M545 Low back pain, unspecified: Secondary | ICD-10-CM

## 2020-01-27 DIAGNOSIS — I10 Essential (primary) hypertension: Secondary | ICD-10-CM

## 2020-01-27 DIAGNOSIS — M62838 Other muscle spasm: Secondary | ICD-10-CM

## 2020-01-27 MED ORDER — TIZANIDINE HCL 4 MG PO TABS: 4.0000 mg | ORAL_TABLET | Freq: Three times a day (TID) | ORAL | 0 refills | Status: DC | PRN

## 2020-01-27 MED ORDER — MELOXICAM 7.5 MG PO TABS: 7.5000 mg | ORAL_TABLET | Freq: Every day | ORAL | 1 refills | Status: DC

## 2020-01-27 MED ORDER — AMLODIPINE BESYLATE 5 MG PO TABS: 5.0000 mg | ORAL_TABLET | Freq: Every day | ORAL | 0 refills | Status: DC

## 2020-01-27 MED ORDER — KETOROLAC TROMETHAMINE 60 MG/2ML IM SOLN
60.0000 mg | Freq: Once | INTRAMUSCULAR | Status: AC
  Administered 2020-01-27: 10:00:00 60 mg via INTRAMUSCULAR

## 2020-01-27 MED ORDER — KETOROLAC TROMETHAMINE 60 MG/2ML IM SOLN
INTRAMUSCULAR | Status: AC
  Filled 2020-01-27: qty 2

## 2020-01-27 NOTE — Discharge Instructions (Addendum)
Do not take more than 2 tablets of meloxicam in a day for pain and inflammation. Tizanidine is a muscle relaxant but can make you sleepy so if this happens then take it at bedtime only. Hydrate well with at least 2 liters (64 ounces) of water daily.   For diabetes or elevated blood sugar, please make sure you are avoiding starchy, carbohydrate foods like pasta, breads, pastry, rice, potatoes, desserts. These foods can elevated your blood sugar. Also, avoid sodas, sweet teas, sugary beverages, fruit juices.  Drinking plain water will be much more helpful, try 64 ounces of water daily.  It is okay to flavor your water naturally by cutting cucumber or lemon and mint or lime, placing it in a picture with water and drinking it over a period of 2 to 3 days as long as it remains refrigerated.    For elevated blood pressure, make sure you are monitoring salt in your diet.  Do not eat restaurant foods and limit processed foods at home, prepare/cook your own foods at home.  Processed foods include things like frozen meals preseasoned meats and dinners, deli meats, canned foods as they are high in sodium/salt.  Make sure your pain attention to sodium labels on foods you by at the grocery store.  For seasoning you can use a brand called Mrs. Dash which includes a lot of salt free seasonings.  Salads - kale, spinach, cabbage, spring mix; use seeds like pumpkin seeds or sunflower seeds, almonds, walnuts or pecans; you can also use 1-2 hard boiled eggs in your salads Fruits - avocadoes, berries (blueberries, raspberries, blackberries), apples, oranges Vegetables - aspargus, cauliflower, broccoli, green beans, brussel spouts, bell peppers; stay away from starchy vegetables like potatoes, carrots, peas  Regarding meat it is better to eat lean meats and limit your red meat consumption including pork.  Wild caught fish, chicken breast are good options.  DO NOT EAT ANY FOODS ON THIS LIST THAT YOU ARE ALLERGIC TO.

## 2020-01-27 NOTE — ED Triage Notes (Signed)
Pt c/o left shoulder and left hip pain after "rolling up a bounce house" on Saturday. Denies any falls or impact to body areas. Denies numbness to fingers/toes.

## 2020-01-27 NOTE — ED Provider Notes (Signed)
[facility]   MRN: [id number] DOB: Jul 26, 1981  Subjective:   Ricardo [name] is a 39 y.o. male presenting for 4-day history of persistent and worsening left-sided back pain, worse over low back and neck extending into his trapezius and proximal part of shoulder.  Patient states that he did strenuous work activity on Saturday, had to roll up a bounce house and feels like he overexerted himself.  He has had good response with Toradol injections in the past and would like this done today.  Regarding his blood pressure, has never been diagnosed with hypertension.  Admits lack of exercise and does not practice a healthy diet.   Current Facility-Administered Medications:  .  ketorolac (TORADOL) injection 60 mg, 60 mg, Intramuscular, Once, Wallis Bamberg, PA-C  Current Outpatient Medications:  .  cyclobenzaprine (FLEXERIL) 5 MG tablet, Take 1 tablet (5 mg total) by mouth 3 (three) times daily as needed for muscle spasms., Disp: 20 tablet, Rfl: 0 .  dicyclomine (BENTYL) 20 MG tablet, Take 1 tablet (20 mg total) by mouth 4 (four) times daily -  before meals and at bedtime., Disp: 30 tablet, Rfl: 3 .  ibuprofen (ADVIL) 600 MG tablet, Take 1 tablet (600 mg total) by mouth every 6 (six) hours as needed., Disp: 30 tablet, Rfl: 0 .  triamcinolone cream (KENALOG) 0.1 %, Apply 1 application topically 2 (two) times daily., Disp: 15 g, Rfl: 0   No Known Allergies  History reviewed. No pertinent past medical history.   History reviewed. No pertinent surgical history.  Family History  Problem Relation Age of Onset  . Irritable bowel syndrome Sister   . Diabetes Sister   . Hyperlipidemia Sister   . Hypertension Sister   . Hypertension Mother   . Hypertension Father   . Hyperlipidemia Father     Social History   Tobacco Use  . Smoking status: Former Games developer  . Smokeless tobacco: Never Used  Substance Use Topics  . Alcohol use: No    Alcohol/week: 0.0 standard drinks  . Drug use: No     Review of Systems  Constitutional: Negative for fever and malaise/fatigue.  HENT: Negative for congestion, ear pain, sinus pain and sore throat.   Eyes: Negative for discharge and redness.  Respiratory: Negative for cough, hemoptysis, shortness of breath and wheezing.   Cardiovascular: Negative for chest pain.  Gastrointestinal: Negative for abdominal pain, diarrhea, nausea and vomiting.  Genitourinary: Negative for dysuria, flank pain and hematuria.  Musculoskeletal: Positive for back pain, joint pain, myalgias and neck pain.  Skin: Negative for rash.  Neurological: Negative for dizziness, tingling, focal weakness, weakness and headaches.  Psychiatric/Behavioral: Negative for depression and substance abuse.     Objective:   Vitals: BP (!) 163/121 (BP Location: Right Arm)   Pulse 92   Temp 98.2 F (36.8 C) (Oral)   Resp 16   SpO2 97%   BP Readings from Last 3 Encounters:  01/27/20 (!) 163/121  12/22/19 (!) 189/113  09/23/19 (!) 147/107   Physical Exam Constitutional:      General: He is not in acute distress.    Appearance: Normal appearance. He is well-developed and normal weight. He is not ill-appearing, toxic-appearing or diaphoretic.  HENT:     Head: Normocephalic and atraumatic.     Right Ear: External ear normal.     Left Ear: External ear normal.     Nose: Nose normal.     Mouth/Throat:     Mouth: Mucous membranes are moist.  Pharynx: Oropharynx is clear.  Eyes:     General: No scleral icterus.       Right eye: No discharge.        Left eye: No discharge.     Extraocular Movements: Extraocular movements intact.     Pupils: Pupils are equal, round, and reactive to light.  Cardiovascular:     Rate and Rhythm: Normal rate and regular rhythm.     Heart sounds: Normal heart sounds. No murmur. No friction rub. No gallop.   Pulmonary:     Effort: Pulmonary effort is normal. No respiratory distress.     Breath sounds: Normal breath sounds. No stridor. No  wheezing, rhonchi or rales.  Musculoskeletal:     Cervical back: Normal range of motion.     Comments: Pain along left side paraspinal muscles of the cervical and lumbar region.  Patient has significant muscle spasms of the trapezius that are painful, strength 5/5 of upper and lower extremities and symmetric.    Skin:    General: Skin is warm and dry.  Neurological:     Mental Status: He is alert and oriented to person, place, and time.     Cranial Nerves: No cranial nerve deficit.     Motor: No weakness.     Coordination: Coordination normal.     Gait: Gait normal.     Deep Tendon Reflexes: Reflexes normal.  Psychiatric:        Mood and Affect: Mood normal.        Behavior: Behavior normal.        Thought Content: Thought content normal.        Judgment: Judgment normal.      Assessment and Plan :   PDMP not reviewed this encounter.  1. Acute pain of left shoulder   2. Essential hypertension   3. Elevated blood pressure reading   4. Cervical strain, acute, initial encounter   5. Acute left-sided low back pain without sciatica   6. Trapezius muscle spasm   7. Trapezius strain, left, initial encounter     Counseled patient extensively on dietary modifications, management of hypertension.  He is to start amlodipine once daily, set up primary care with a new PCP.  For his shoulder, counseled on use of NSAIDs in setting of uncontrolled hypertension.  He is okay with this, IM Toradol given in clinic.  Meloxicam and tizanidine as an outpatient. Counseled patient on potential for adverse effects with medications prescribed/recommended today, ER and return-to-clinic precautions discussed, patient verbalized understanding.    Jaynee Eagles, PA-C 01/27/20 1026

## 2020-02-02 ENCOUNTER — Other Ambulatory Visit: Payer: Self-pay

## 2020-02-02 ENCOUNTER — Ambulatory Visit (HOSPITAL_COMMUNITY)
Admission: EM | Admit: 2020-02-02 | Discharge: 2020-02-02 | Disposition: A | Discharge status: Home / Self Care | Payer: 59 | Attending: Physician Assistant | Admitting: Physician Assistant

## 2020-02-02 ENCOUNTER — Encounter (HOSPITAL_COMMUNITY): Payer: Self-pay

## 2020-02-02 DIAGNOSIS — S46812A Strain of other muscles, fascia and tendons at shoulder and upper arm level, left arm, initial encounter: Secondary | ICD-10-CM

## 2020-02-02 DIAGNOSIS — G8929 Other chronic pain: Secondary | ICD-10-CM | POA: Diagnosis not present

## 2020-02-02 DIAGNOSIS — M5442 Lumbago with sciatica, left side: Secondary | ICD-10-CM | POA: Diagnosis not present

## 2020-02-02 MED ORDER — KETOROLAC TROMETHAMINE 60 MG/2ML IM SOLN
INTRAMUSCULAR | Status: AC
  Filled 2020-02-02: qty 2

## 2020-02-02 MED ORDER — KETOROLAC TROMETHAMINE 60 MG/2ML IM SOLN
60.0000 mg | Freq: Once | INTRAMUSCULAR | Status: AC
  Administered 2020-02-02: 60 mg via INTRAMUSCULAR

## 2020-02-02 NOTE — ED Triage Notes (Addendum)
Patient following up for back and shoulder pain. Patient falling asleep in triage.

## 2020-02-02 NOTE — Discharge Instructions (Signed)
Continue the Mobic/meloxicam 1-2 tablets daily,  and zanaflex/ tizanadine as previously prescibed - zanaflex/tizanadine will make you sleepy, so take this primarily at night, do not drive, drink or work within 8 hours of taking this  Establish with a primary care for long term management of your chronic pain and blood pressure

## 2020-02-02 NOTE — ED Provider Notes (Signed)
[facility]    CSN: [id number] Arrival date & time: 02/02/20  1448      History   Chief Complaint Chief Complaint  Patient presents with  . Back Pain  . Shoulder Pain    HPI Ricardo [name] is a 39 y.o. male.   Patient with known history of low back and left shoulder pains presents for evaluation of left sided low back and left shoulder pain.  Reports he was seen on 01/27/2020 and this clinic for low back pain.  He reports he received a Toradol shot that day and it helped significantly and he states he is interested in another 1 of these his back is continue to bother him.  He also reports left-sided shoulder pain.  He reports this started over the last few days but has been an issue in the past.  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  He points to his left trapezius muscle and into the left side of his neck when describing where the pain is.  He reports the pain is achy and hurts more with movement.  With regard to his left sided back pain he reports this is continuing to bother him.  He reports some shooting pain down his leg.  He describes his pain is burning in sensation.  Denies numbness, tingling, weakness.  Denies change in bowel or bladder.  Patient has numerous questions about the medicines he was prescribed last visit his, such that he did not appear to understand what the Mobic and tizanidine were.  He denies recent injury to either the shoulder, neck or back.     History reviewed. No pertinent past medical history.  Patient Active Problem List   Diagnosis Date Noted  . 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 Irritable bowel syndrome 07/09/2012    History reviewed. No pertinent surgical history.     Home Medications    Prior to Admission medications   Medication Sig Start Date End Date Taking? Authorizing Provider  amLODipine (NORVASC) 5 MG tablet Take 1 tablet (5 mg total) by mouth daily. 01/27/20   Jaynee Eagles, PA-C    cyclobenzaprine (FLEXERIL) 5 MG tablet Take 1 tablet (5 mg total) by mouth 3 (three) times daily as needed for muscle spasms. 12/22/19   Lamptey, Myrene Galas, MD  dicyclomine (BENTYL) 20 MG tablet Take 1 tablet (20 mg total) by mouth 4 (four) times daily -  before meals and at bedtime. 12/28/14   Leandrew Koyanagi, MD  ibuprofen (ADVIL) 600 MG tablet Take 1 tablet (600 mg total) by mouth every 6 (six) hours as needed. 12/22/19   LampteyMyrene Galas, MD  meloxicam (MOBIC) 7.5 MG tablet Take 1-2 tablets (7.5-15 mg total) by mouth daily. 01/27/20   Jaynee Eagles, PA-C  tiZANidine (ZANAFLEX) 4 MG tablet Take 1 tablet (4 mg total) by mouth every 8 (eight) hours as needed. 01/27/20   Jaynee Eagles, PA-C  triamcinolone cream (KENALOG) 0.1 % Apply 1 application topically 2 (two) times daily. 09/23/19   Jaynee Eagles, PA-C    Family History Family History  Problem Relation Age of Onset  . Irritable bowel syndrome Sister   . Diabetes Sister   . Hyperlipidemia Sister   . Hypertension Sister   . Hypertension Mother   . Hypertension Father   . Hyperlipidemia Father     Social History Social History   Tobacco Use  . Smoking status: Former Research scientist (life sciences)  . Smokeless tobacco: Never Used  Substance Use Topics  . Alcohol use: No    Alcohol/week: 0.0 standard drinks  . Drug use: No     Allergies   Patient has no known allergies.   Review of Systems Review of Systems  Per HPI Physical Exam Triage Vital Signs ED Triage Vitals  Enc Vitals Group     BP 02/02/20 1550 (!) 152/97     Pulse Rate 02/02/20 1550 81     Resp 02/02/20 1550 16     Temp 02/02/20 1550 98.4 F (36.9 C)     Temp Source 02/02/20 1550 Oral     SpO2 02/02/20 1550 100 %     Weight --      Height --      Head Circumference --      Peak Flow --      Pain Score 02/02/20 1549 8     Pain Loc --      Pain Edu? --      Excl. in GC? --    No data found.  Updated Vital Signs BP (!) 152/97 (BP Location: Right Arm)   Pulse 81   Temp 98.4 F  (36.9 C) (Oral)   Resp 16   SpO2 100%   Visual Acuity Right Eye Distance:   Left Eye Distance:   Bilateral Distance:    Right Eye Near:   Left Eye Near:    Bilateral Near:     Physical Exam Vitals and nursing note reviewed.  Constitutional:      Appearance: Normal appearance. He is well-developed.  HENT:     Head: Normocephalic and atraumatic.  Eyes:     Conjunctiva/sclera: Conjunctivae normal.  Cardiovascular:     Rate and Rhythm: Normal rate and regular rhythm.     Heart sounds: No murmur.  Pulmonary:     Effort: Pulmonary effort is normal. No respiratory distress.     Breath sounds: Normal breath sounds.  Abdominal:     Palpations: Abdomen is soft.     Tenderness: There is no abdominal tenderness.  Musculoskeletal:     Cervical back: Normal range of motion and neck supple. Tenderness (left paraspinal musculature. no midlines TTP) present. No rigidity.     Comments: No deformity of the spine.  Tenderness palpation of the left-sided paraspinal musculature in the lumbar region.  No midline tenderness of the lumbar, thoracic or cervical spine.  Straight leg raise negative.  Strength 5/5, sensation grossly intact equally bilaterally.  Patient is ambulatory without issue in clinic.   Skin:    General: Skin is warm and dry.  Neurological:     General: No focal deficit present.     Mental Status: He is alert and oriented to person, place, and time.      UC Treatments / Results  Labs (all labs ordered are listed, but only abnormal results are displayed) Labs Reviewed - No data to display  EKG   Radiology No results found.  Procedures Procedures (including critical care time)  Medications Ordered in UC Medications  ketorolac (TORADOL) injection 60 mg (60 mg Intramuscular Given 02/02/20 1712)    Initial Impression / Assessment and Plan / UC Course  I have reviewed the triage vital signs and the nursing notes.  Pertinent labs & imaging results that were  available during my care of the patient were reviewed by me and considered in my medical decision making (see chart for details).     #Chronic left-sided low back pain #Left trapezius strain Patient is  a 39 year old gentleman with what appears now to be chronic left-sided low back pain with left trapezius strain.  Given patient reported response to Toradol, we will give him an injection today.  I discussed that patient's previously prescribed treatment regiment is a very good 1 and he should continue the Mobic and tizanidine as prescribed.  Patient should have an additional week remaining of these medications, so we would not prescribe more today.  We discussed that the tizanidine will make him sleepy so I would recommend taking this primarily at night.  Discussed that the patient should establish with a primary care for long-term management of his chronic pain.  Patient verbalized understanding. Final Clinical Impressions(s) / UC Diagnoses   Final diagnoses:  Chronic left-sided low back pain with left-sided sciatica  Strain of left trapezius muscle, initial encounter     Discharge Instructions     Continue the Mobic/meloxicam 1-2 tablets daily,  and zanaflex/ tizanadine as previously prescibed - zanaflex/tizanadine will make you sleepy, so take this primarily at night, do not drive, drink or work within 8 hours of taking this  Establish with a primary care for long term management of your chronic pain and blood pressure      ED Prescriptions    None     I have reviewed the PDMP during this encounter.   Hermelinda Medicus, PA-C 02/03/20 [phone]

## 2020-02-24 ENCOUNTER — Ambulatory Visit (HOSPITAL_COMMUNITY)
Admission: EM | Admit: 2020-02-24 | Discharge: 2020-02-24 | Disposition: A | Discharge status: Home / Self Care | Payer: 59 | Attending: Family Medicine | Admitting: Family Medicine

## 2020-02-24 ENCOUNTER — Other Ambulatory Visit: Payer: Self-pay

## 2020-02-24 ENCOUNTER — Encounter (HOSPITAL_COMMUNITY): Payer: Self-pay

## 2020-02-24 DIAGNOSIS — M791 Myalgia, unspecified site: Secondary | ICD-10-CM

## 2020-02-24 MED ORDER — MELOXICAM 7.5 MG PO TABS: 7.5000 mg | ORAL_TABLET | Freq: Every day | ORAL | 1 refills | Status: DC

## 2020-02-24 MED ORDER — TIZANIDINE HCL 4 MG PO TABS: 4.0000 mg | ORAL_TABLET | Freq: Three times a day (TID) | ORAL | 0 refills | Status: DC | PRN

## 2020-02-24 MED ORDER — KETOROLAC TROMETHAMINE 30 MG/ML IJ SOLN
30.0000 mg | Freq: Once | INTRAMUSCULAR | Status: AC
  Administered 2020-02-24: 30 mg via INTRAMUSCULAR

## 2020-02-24 MED ORDER — KETOROLAC TROMETHAMINE 30 MG/ML IJ SOLN
INTRAMUSCULAR | Status: AC
  Filled 2020-02-24: qty 1

## 2020-02-24 NOTE — ED Triage Notes (Signed)
Pt presents for follow up with recurrent left side neck pain, left shoulder pain and back pain.

## 2020-02-24 NOTE — Discharge Instructions (Addendum)
Believe this is all muscle pain Toradol given here for the pain.  Refilled the muscle relaxant and pain medication.  Contacts given for primary care.

## 2020-02-25 NOTE — ED Provider Notes (Signed)
[facility]    CSN: [id number] Arrival date & time: 02/24/20  1134      History   Chief Complaint Chief Complaint  Patient presents with  . Follow-up    HPI Ricardo [name] is a 39 y.o. male.   Patient is a 39 year old male presents today for follow-up.  He is here for recurrent left-sided neck pain, left shoulder pain and back pain.  This has been an ongoing issue for him off and on for a while.  This is the fourth visit this year so far for similar complaints.  Reporting the medicine he has taken in the past helps and he is requesting Toradol injection today. He has not taken anything for the pain. No numbness, tingling, weakness, loss of bowel or bladder infection. No chest pain, SOB.   ROS per HPI      History reviewed. No pertinent past medical history.  Patient Active Problem List   Diagnosis Date Noted  . 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 Irritable bowel syndrome 07/09/2012    History reviewed. No pertinent surgical history.     Home Medications    Prior to Admission medications   Medication Sig Start Date End Date Taking? Authorizing Provider  amLODipine (NORVASC) 5 MG tablet Take 1 tablet (5 mg total) by mouth daily. 01/27/20   Wallis Bamberg, PA-C  ibuprofen (ADVIL) 600 MG tablet Take 1 tablet (600 mg total) by mouth every 6 (six) hours as needed. 12/22/19   LampteyBritta Mccreedy, MD  meloxicam (MOBIC) 7.5 MG tablet Take 1-2 tablets (7.5-15 mg total) by mouth daily. 02/24/20   Dahlia Byes A, NP  tiZANidine (ZANAFLEX) 4 MG tablet Take 1 tablet (4 mg total) by mouth every 8 (eight) hours as needed. 02/24/20   Janace Aris, NP    Family History Family History  Problem Relation Age of Onset  . Irritable bowel syndrome Sister   . Diabetes Sister   . Hyperlipidemia Sister   . Hypertension Sister   . Hypertension Mother   . Hypertension Father   . Hyperlipidemia Father     Social History Social History   Tobacco Use  . Smoking status: Former  Games developer  . Smokeless tobacco: Never Used  Substance Use Topics  . Alcohol use: No    Alcohol/week: 0.0 standard drinks  . Drug use: No     Allergies   Patient has no known allergies.   Review of Systems Review of Systems   Physical Exam Triage Vital Signs ED Triage Vitals [02/24/20 1206]  Enc Vitals Group     BP (!) 147/111     Pulse Rate 100     Resp 17     Temp 98.8 F (37.1 C)     Temp Source Oral     SpO2 98 %     Weight      Height      Head Circumference      Peak Flow      Pain Score 8     Pain Loc      Pain Edu?      Excl. in GC?    No data found.  Updated Vital Signs BP (!) 147/111 (BP Location: Right Arm)   Pulse 100   Temp 98.8 F (37.1 C) (Oral)   Resp 17   SpO2 98%   Visual Acuity Right Eye Distance:   Left Eye Distance:   Bilateral Distance:    Right Eye Near:   Left Eye  Near:    Bilateral Near:     Physical Exam Vitals and nursing note reviewed.  Constitutional:      Appearance: Normal appearance.  HENT:     Head: Normocephalic and atraumatic.     Nose: Nose normal.  Eyes:     Conjunctiva/sclera: Conjunctivae normal.  Pulmonary:     Effort: Pulmonary effort is normal.  Musculoskeletal:        General: Normal range of motion.     Cervical back: Normal range of motion.       Back:     Comments: Lumbar paravertebral muscle tenderness.  Trapezius tenderness.  No spinal tenderness.   Skin:    General: Skin is warm and dry.  Neurological:     Mental Status: He is alert.  Psychiatric:        Mood and Affect: Mood normal.      UC Treatments / Results  Labs (all labs ordered are listed, but only abnormal results are displayed) Labs Reviewed - No data to display  EKG   Radiology No results found.  Procedures Procedures (including critical care time)  Medications Ordered in UC Medications  ketorolac (TORADOL) 30 MG/ML injection 30 mg (30 mg Intramuscular Given 02/24/20 1307)    Initial Impression / Assessment and  Plan / UC Course  I have reviewed the triage vital signs and the nursing notes.  Pertinent labs & imaging results that were available during my care of the patient were reviewed by me and considered in my medical decision making (see chart for details).     Muscle pain- this is chronic for him and worse with working. Probably over use of the muscles. We will refill the meloxicam, Zanaflex and give him Toradol injection here for pain as requested.  Recommended heat/ice , rest and he needs primary care follow up.  Final Clinical Impressions(s) / UC Diagnoses   Final diagnoses:  Muscle pain     Discharge Instructions     Believe this is all muscle pain Toradol given here for the pain.  Refilled the muscle relaxant and pain medication.  Contacts given for primary care.     ED Prescriptions    Medication Sig Dispense Auth. Provider   meloxicam (MOBIC) 7.5 MG tablet Take 1-2 tablets (7.5-15 mg total) by mouth daily. 30 tablet Kanan Sobek A, NP   tiZANidine (ZANAFLEX) 4 MG tablet Take 1 tablet (4 mg total) by mouth every 8 (eight) hours as needed. 30 tablet Loura Halt A, NP     PDMP not reviewed this encounter.   Orvan July, NP 02/25/20 1111

## 2020-03-09 ENCOUNTER — Encounter (HOSPITAL_COMMUNITY): Payer: Self-pay | Admitting: Emergency Medicine

## 2020-03-09 ENCOUNTER — Ambulatory Visit (HOSPITAL_COMMUNITY)
Admission: EM | Admit: 2020-03-09 | Discharge: 2020-03-09 | Disposition: A | Discharge status: Home / Self Care | Payer: 59 | Attending: Urgent Care | Admitting: Urgent Care

## 2020-03-09 ENCOUNTER — Other Ambulatory Visit: Payer: Self-pay

## 2020-03-09 DIAGNOSIS — M542 Cervicalgia: Secondary | ICD-10-CM | POA: Diagnosis not present

## 2020-03-09 DIAGNOSIS — R030 Elevated blood-pressure reading, without diagnosis of hypertension: Secondary | ICD-10-CM

## 2020-03-09 DIAGNOSIS — I10 Essential (primary) hypertension: Secondary | ICD-10-CM

## 2020-03-09 DIAGNOSIS — M62838 Other muscle spasm: Secondary | ICD-10-CM

## 2020-03-09 MED ORDER — TIZANIDINE HCL 4 MG PO TABS: 4.0000 mg | ORAL_TABLET | Freq: Three times a day (TID) | ORAL | 1 refills | Status: DC | PRN

## 2020-03-09 MED ORDER — METHYLPREDNISOLONE ACETATE 80 MG/ML IJ SUSP
80.0000 mg | Freq: Once | INTRAMUSCULAR | Status: AC
  Administered 2020-03-09: 80 mg via INTRAMUSCULAR

## 2020-03-09 MED ORDER — METHYLPREDNISOLONE ACETATE 80 MG/ML IJ SUSP
INTRAMUSCULAR | Status: AC
  Filled 2020-03-09: qty 1

## 2020-03-09 NOTE — ED Triage Notes (Signed)
Pt states he pulled a muscle a while back (2 months ago) and still has pain. Patient states he  "needs another shot and that the medications only helped a little bit but the shot he got last time helped."

## 2020-03-09 NOTE — Discharge Instructions (Signed)
Meloxicam and ibuprofen are nsaids for pain and inflammation. You should take one or the other. Right now that you have received a depomedrol shot, avoid using any nsaid for 1 week.    For diabetes or elevated blood sugar, please make sure you are avoiding starchy, carbohydrate foods like pasta, breads, pastry, rice, potatoes, desserts. These foods can elevated your blood sugar. Also, avoid sodas, sweet teas, sugary beverages, fruit juices.  Drinking plain water will be much more helpful, try 64 ounces of water daily.  It is okay to flavor your water naturally by cutting cucumber, lemon, mint or lime, placing it in a picture with water and drinking it over a period of 2 to 3 days as long as it remains refrigerated.    For elevated blood pressure, make sure you are monitoring salt in your diet.  Do not eat restaurant foods and limit processed foods at home, prepare/cook your own foods at home.  Processed foods include things like frozen meals preseasoned meats and dinners, deli meats, canned foods as they are high in sodium/salt.  Make sure your pain attention to sodium labels on foods you by at the grocery store.  For seasoning you can use a brand called Mrs. Dash which includes a lot of salt free seasonings.  Salads - kale, spinach, cabbage, spring mix; use seeds like pumpkin seeds or sunflower seeds, almonds, walnuts or pecans; you can also use 1-2 hard boiled eggs in your salads Fruits - avocadoes, berries (blueberries, raspberries, blackberries), apples, oranges, pomegranate, pear; avoid eating bananas, grapes regularly Vegetables - aspargus, cauliflower, broccoli, green beans, brussel spouts, bell peppers; stay away from starchy vegetables like potatoes, carrots, peas  Regarding meat it is better to eat lean meats and limit your red meat including pork to once a week.  Wild caught fish, chicken breast are good options as they tend to be leaner sources of good protein.   DO NOT EAT ANY FOODS ON THIS  LIST THAT YOU ARE ALLERGIC TO.

## 2020-03-09 NOTE — ED Provider Notes (Signed)
[facility]   MRN: [id number] DOB: Jun 25, 1981  Subjective:   Ricardo [name] is a 39 y.o. male presenting for recurrent left-sided neck pain, trapezius pain and tightness.  Patient had MRI October 2020 and was negative.  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  States that he knows the problem is related to this because after he works his symptoms return.  Denies weakness, inability to use arms.  Has been using meloxicam or ibuprofen and tizanidine.  Has previously gotten Toradol injections with good temporary relief.  No current facility-administered medications for this encounter.  Current Outpatient Medications:  .  amLODipine (NORVASC) 5 MG tablet, Take 1 tablet (5 mg total) by mouth daily., Disp: 90 tablet, Rfl: 0 .  ibuprofen (ADVIL) 600 MG tablet, Take 1 tablet (600 mg total) by mouth every 6 (six) hours as needed., Disp: 30 tablet, Rfl: 0 .  meloxicam (MOBIC) 7.5 MG tablet, Take 1-2 tablets (7.5-15 mg total) by mouth daily., Disp: 30 tablet, Rfl: 1 .  tiZANidine (ZANAFLEX) 4 MG tablet, Take 1 tablet (4 mg total) by mouth every 8 (eight) hours as needed., Disp: 30 tablet, Rfl: 0   No Known Allergies  History reviewed. No pertinent past medical history.   History reviewed. No pertinent surgical history.  Family History  Problem Relation Age of Onset  . Irritable bowel syndrome Sister   . Diabetes Sister   . Hyperlipidemia Sister   . Hypertension Sister   . Hypertension Mother   . Hypertension Father   . Hyperlipidemia Father     Social History   Tobacco Use  . Smoking status: Former Research scientist (life sciences)  . Smokeless tobacco: Never Used  Substance Use Topics  . Alcohol use: No    Alcohol/week: 0.0 standard drinks  . Drug use: No    ROS   Objective:   Vitals: BP (!) 152/99 (BP Location: Right Arm)   Pulse (!) 101   Temp 99 F  (37.2 C) (Oral)   Resp 16   SpO2 100%   Physical Exam Constitutional:      General: He is not in acute distress.    Appearance: Normal appearance. He is well-developed and normal weight. He is not ill-appearing, toxic-appearing or diaphoretic.  HENT:     Head: Normocephalic and atraumatic.     Right Ear: External ear normal.     Left Ear: External ear normal.     Nose: Nose normal.     Mouth/Throat:     Pharynx: Oropharynx is clear.  Eyes:     General: No scleral icterus.       Right eye: No discharge.        Left eye: No discharge.     Extraocular Movements: Extraocular movements intact.     Pupils: Pupils are equal, round, and reactive to light.  Cardiovascular:     Rate and Rhythm: Normal rate.  Pulmonary:     Effort: Pulmonary effort is normal.  Musculoskeletal:     Cervical back: Spasms (paraspinal muscles and trapezius) and tenderness (paraspinal muscles of left side) present. No swelling, edema, deformity, erythema, signs of trauma, lacerations, rigidity, torticollis, bony tenderness or crepitus. Pain with movement present. Decreased range of motion.  Skin:    General: Skin is warm and dry.  Neurological:     Mental Status: He is alert and oriented  to person, place, and time.     Cranial Nerves: No cranial nerve deficit.     Motor: No weakness.     Coordination: Coordination normal.     Gait: Gait normal.     Deep Tendon Reflexes: Reflexes normal.  Psychiatric:        Mood and Affect: Mood normal.        Behavior: Behavior normal.        Thought Content: Thought content normal.        Judgment: Judgment normal.      Assessment and Plan :   PDMP not reviewed this encounter.  1. Neck pain   2. Trapezius muscle spasm   3. Essential hypertension   4. Elevated blood pressure reading     IM Depo-Medrol in clinic today.  Recommended holding off on NSAID until 1 week.  Refilled tizanidine.  Follow-up with spine specialist or chiropractor.  Emphasized need to  modify work activities.  Follow-up with new PCP soon as possible, maintain amlodipine in the meantime. Counseled patient on potential for adverse effects with medications prescribed/recommended today, ER and return-to-clinic precautions discussed, patient verbalized understanding.    Wallis Bamberg, [place] 03/09/20 [id number]

## 2020-03-31 ENCOUNTER — Ambulatory Visit
Admission: EM | Admit: 2020-03-31 | Discharge: 2020-03-31 | Disposition: A | Discharge status: Home / Self Care | Payer: 59 | Attending: Emergency Medicine | Admitting: Emergency Medicine

## 2020-03-31 DIAGNOSIS — S46812A Strain of other muscles, fascia and tendons at shoulder and upper arm level, left arm, initial encounter: Secondary | ICD-10-CM

## 2020-03-31 MED ORDER — DICLOFENAC SODIUM 1 % EX GEL
2.0000 g | Freq: Four times a day (QID) | CUTANEOUS | 0 refills | Status: DC
Start: 2020-03-31 — End: 2021-05-11

## 2020-03-31 MED ORDER — METHYLPREDNISOLONE SODIUM SUCC 125 MG IJ SOLR
125.0000 mg | Freq: Once | INTRAMUSCULAR | Status: AC
  Administered 2020-03-31: 125 mg via INTRAMUSCULAR

## 2020-03-31 MED ORDER — TIZANIDINE HCL 4 MG PO TABS: 4.0000 mg | ORAL_TABLET | Freq: Three times a day (TID) | ORAL | 0 refills | Status: DC | PRN

## 2020-03-31 NOTE — ED Triage Notes (Signed)
Pt c/o lt should/neck pain radiating down to buttocks for over the past couple of months. No new injury.

## 2020-03-31 NOTE — Discharge Instructions (Addendum)
Recommend RICE: rest, ice, compression, elevation as needed for pain.    Heat therapy (hot compress, warm wash rag, hot showers, etc.) can help relax muscles and soothe muscle aches. Cold therapy (ice packs) can be used to help swelling both after injury and after prolonged use of areas of chronic pain/aches.  For pain: recommend 350 mg-1000 mg of Tylenol (acetaminophen) and/or 200 mg - 800 mg of Advil (ibuprofen, Motrin) every 8 hours as needed.  May alternate between the two throughout the day as they are generally safe to take together.  DO NOT exceed more than 3000 mg of Tylenol or 3200 mg of ibuprofen in a 24 hour period as this could damage your stomach, kidneys, liver, or increase your bleeding risk.  May take muscle relaxer as needed for severe pain / spasm.  (This medication may cause you to become tired so it is important you do not drink alcohol or operate heavy machinery while on this medication.  Recommend your first dose to be taken before bedtime to monitor for side effects safely)  Important to follow-up with orthopedics for further evaluation and management.

## 2020-03-31 NOTE — ED Provider Notes (Signed)
[name]    CSN: [id number] Arrival date & time: 03/31/20  7209      History   Chief Complaint Chief Complaint  Patient presents with  . Shoulder Pain    HPI Ricardo [name] is a 39 y.o. male with history of IBS, ulcerative proctitis presenting for left shoulder and neck pain.  States it will occasionally radiate down towards his buttocks.  Has been dealing with this for the last few months.  Denies repeat injury, though does perform heavy lifting at work.  Denies limited range of motion of neck, fever, upper extremity numbness or weakness, change in bowel or bladder habit, lower extremity numbness or weakness.  No chest pain, difficulty breathing, cough.  Has received steroid injection in the past which has helped.  States muscle relaxers help as well, though has not had any of this during this flare which has been ongoing for the last few days.   History reviewed. No pertinent past medical history.  Patient Active Problem List   Diagnosis Date Noted  . 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 Irritable bowel syndrome 07/09/2012    History reviewed. No pertinent surgical history.     Home Medications    Prior to Admission medications   Medication Sig Start Date End Date Taking? Authorizing Provider  amLODipine (NORVASC) 5 MG tablet Take 1 tablet (5 mg total) by mouth daily. 01/27/20   Wallis Bamberg, PA-C  diclofenac Sodium (VOLTAREN) 1 % GEL Apply 2 g topically 4 (four) times daily. 03/31/20   Hall-Potvin, [place], PA-C  tiZANidine (ZANAFLEX) 4 MG tablet Take 1 tablet (4 mg total) by mouth every 8 (eight) hours as needed. 03/31/20   Hall-Potvin, [place], PA-C    Family History Family History  Problem Relation Age of Onset  . Irritable bowel syndrome Sister   . Diabetes Sister   . Hyperlipidemia Sister   . Hypertension Sister   . Hypertension Mother   . Hypertension Father   . Hyperlipidemia Father     Social History Social History   Tobacco Use  .  Smoking status: Former Games developer  . Smokeless tobacco: Never Used  Substance Use Topics  . Alcohol use: No    Alcohol/week: 0.0 standard drinks  . Drug use: No     Allergies   Patient has no known allergies.   Review of Systems As per HPI   Physical Exam Triage Vital Signs ED Triage Vitals  Enc Vitals Group     BP      Pulse      Resp      Temp      Temp src      SpO2      Weight      Height      Head Circumference      Peak Flow      Pain Score      Pain Loc      Pain Edu?      Excl. in GC?    No data found.  Updated Vital Signs BP (!) 154/106 (BP Location: Left Arm)   Pulse 93   Temp 98.2 F (36.8 C) (Oral)   Resp 16   SpO2 98%   Visual Acuity Right Eye Distance:   Left Eye Distance:   Bilateral Distance:    Right Eye Near:   Left Eye Near:    Bilateral Near:     Physical Exam Constitutional:      General: He is not in acute  distress. HENT:     Head: Normocephalic and atraumatic.  Eyes:     General: No scleral icterus.    Pupils: Pupils are equal, round, and reactive to light.  Cardiovascular:     Rate and Rhythm: Normal rate.  Pulmonary:     Effort: Pulmonary effort is normal. No respiratory distress.     Breath sounds: No wheezing.  Musculoskeletal:        General: Swelling and tenderness present. No deformity. Normal range of motion.     Comments: Left trapezius  Skin:    General: Skin is warm.     Capillary Refill: Capillary refill takes less than 2 seconds.     Coloration: Skin is not jaundiced or pale.     Findings: No bruising, erythema or rash.  Neurological:     General: No focal deficit present.     Mental Status: He is alert and oriented to person, place, and time.      UC Treatments / Results  Labs (all labs ordered are listed, but only abnormal results are displayed) Labs Reviewed - No data to display  EKG   Radiology No results found.  Procedures Procedures (including critical care time)  Medications Ordered  in UC Medications  methylPREDNISolone sodium succinate (SOLU-MEDROL) 125 mg/2 mL injection 125 mg (125 mg Intramuscular Given 03/31/20 1029)    Initial Impression / Assessment and Plan / UC Course  I have reviewed the triage vital signs and the nursing notes.  Pertinent labs & imaging results that were available during my care of the patient were reviewed by me and considered in my medical decision making (see chart for details).     Patient appears well.  Denies repeat trauma, has had MRI in October 2020 with no disc herniation or neural impingement and cervical spine.  There was an indeterminate 2 cm right anterior neck mass with recommendation for further evaluation via CT.  No mass appreciable on exam.  Patient denies difficulty breathing or swallowing.  Stressed importance of further follow-up with sports medicine: Contact information provided.  Solu-Medrol given in office which patient tolerated well.  Will also trial diclofenac gel as outlined below.  Return precautions discussed, patient verbalized understanding and is agreeable to plan. Final Clinical Impressions(s) / UC Diagnoses   Final diagnoses:  Strain of left trapezius muscle, initial encounter     Discharge Instructions     Recommend RICE: rest, ice, compression, elevation as needed for pain.    Heat therapy (hot compress, warm wash rag, hot showers, etc.) can help relax muscles and soothe muscle aches. Cold therapy (ice packs) can be used to help swelling both after injury and after prolonged use of areas of chronic pain/aches.  For pain: recommend 350 mg-1000 mg of Tylenol (acetaminophen) and/or 200 mg - 800 mg of Advil (ibuprofen, Motrin) every 8 hours as needed.  May alternate between the two throughout the day as they are generally safe to take together.  DO NOT exceed more than 3000 mg of Tylenol or 3200 mg of ibuprofen in a 24 hour period as this could damage your stomach, kidneys, liver, or increase your bleeding  risk.  May take muscle relaxer as needed for severe pain / spasm.  (This medication may cause you to become tired so it is important you do not drink alcohol or operate heavy machinery while on this medication.  Recommend your first dose to be taken before bedtime to monitor for side effects safely)  Important to follow-up with  orthopedics for further evaluation and management.    ED Prescriptions    Medication Sig Dispense Auth. Provider   tiZANidine (ZANAFLEX) 4 MG tablet Take 1 tablet (4 mg total) by mouth every 8 (eight) hours as needed. 16 tablet Hall-Potvin, [place], PA-C   diclofenac Sodium (VOLTAREN) 1 % GEL Apply 2 g topically 4 (four) times daily. 100 g Hall-Potvin, [place], PA-C     I have reviewed the PDMP during this encounter.   Hall-Potvin, [place], [place] 03/31/20 1035

## 2020-05-10 ENCOUNTER — Ambulatory Visit
Admission: EM | Admit: 2020-05-10 | Discharge: 2020-05-10 | Disposition: A | Discharge status: Home / Self Care | Payer: 59 | Attending: Physician Assistant | Admitting: Physician Assistant

## 2020-05-10 DIAGNOSIS — S46812A Strain of other muscles, fascia and tendons at shoulder and upper arm level, left arm, initial encounter: Secondary | ICD-10-CM

## 2020-05-10 MED ORDER — TIZANIDINE HCL 4 MG PO TABS: 4.0000 mg | ORAL_TABLET | Freq: Three times a day (TID) | ORAL | 0 refills | Status: DC | PRN

## 2020-05-10 MED ORDER — DEXAMETHASONE SODIUM PHOSPHATE 10 MG/ML IJ SOLN
10.0000 mg | Freq: Once | INTRAMUSCULAR | Status: AC
  Administered 2020-05-10: 10 mg via INTRAMUSCULAR

## 2020-05-10 MED ORDER — PREDNISONE 50 MG PO TABS
50.0000 mg | ORAL_TABLET | Freq: Every day | ORAL | 0 refills | Status: DC
Start: 2020-05-10 — End: 2020-06-23

## 2020-05-10 NOTE — ED Provider Notes (Signed)
[name]    CSN: [id number] Arrival date & time: 05/10/20  1646      History   Chief Complaint Chief Complaint  Patient presents with   Neck Pain    HPI Ricardo [name] is a 39 y.o. male.   39 year old male comes in for recurrent left neck/shoulder pain. States had been doing strenuous activity in the past and feels is what causes the symptoms. However, has tried to decrease heavy lifting without significant relief. Denies numbness/tingling, loss of grip strength. RHD     History reviewed. No pertinent past medical history.  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 No pertinent surgical history.     Home Medications    Prior to Admission medications   Medication Sig Start Date End Date Taking? Authorizing Provider  amLODipine (NORVASC) 5 MG tablet Take 1 tablet (5 mg total) by mouth daily. 01/27/20   Wallis Bamberg, PA-C  diclofenac Sodium (VOLTAREN) 1 % GEL Apply 2 g topically 4 (four) times daily. 03/31/20   Hall-Potvin, [place], PA-C  predniSONE (DELTASONE) 50 MG tablet Take 1 tablet (50 mg total) by mouth daily with breakfast. 05/10/20   Cathie Hoops, Shamon Lobo V, PA-C  tiZANidine (ZANAFLEX) 4 MG tablet Take 1 tablet (4 mg total) by mouth every 8 (eight) hours as needed. 05/10/20   Belinda Fisher, PA-C    Family History Family History  Problem Relation Age of Onset   Irritable bowel syndrome Sister    Diabetes Sister    Hyperlipidemia Sister    Hypertension Sister    Hypertension Mother    Hypertension Father    Hyperlipidemia Father     Social History Social History   Tobacco Use   Smoking status: Former Smoker   Smokeless tobacco: Never Used  Substance Use Topics   Alcohol use: No    Alcohol/week: 0.0 standard drinks   Drug use: No     Allergies   Patient has no known allergies.   Review of Systems Review of Systems  Reason  unable to perform ROS: See HPI as above.     Physical Exam Triage Vital Signs ED Triage Vitals  Enc Vitals Group     BP 05/10/20 1743 (!) 164/82     Pulse Rate 05/10/20 1743 91     Resp 05/10/20 1743 18     Temp 05/10/20 1743 98.8 F (37.1 C)     Temp Source 05/10/20 1743 Oral     SpO2 05/10/20 1743 98 %     Weight --      Height --      Head Circumference --      Peak Flow --      Pain Score 05/10/20 1744 7     Pain Loc --      Pain Edu? --      Excl. in GC? --    No data found.  Updated Vital Signs BP (!) 164/82 (BP Location: Left Arm)    Pulse 91    Temp 98.8 F (37.1 C) (Oral)    Resp 18    SpO2 98%   Physical Exam Constitutional:      General: He is not in acute distress.    Appearance: Normal appearance. He is well-developed. He is not toxic-appearing or diaphoretic.  HENT:     Head: Normocephalic and atraumatic.  Eyes:  Conjunctiva/sclera: Conjunctivae normal.     Pupils: Pupils are equal, round, and reactive to light.  Cardiovascular:     Rate and Rhythm: Normal rate and regular rhythm.  Pulmonary:     Effort: Pulmonary effort is normal. No respiratory distress.     Comments: LCTAB Musculoskeletal:     Cervical back: Normal range of motion and neck supple.     Comments: No tenderness to palpation of spinous processes.  Tenderness to palpation of left cervical and middle trapezius area. Full ROM of neck, BUE. Strength 5/5. Sensation intact. Radial pulse 2+  Skin:    General: Skin is warm and dry.  Neurological:     Mental Status: He is alert and oriented to person, place, and time.      UC Treatments / Results  Labs (all labs ordered are listed, but only abnormal results are displayed) Labs Reviewed - No data to display  EKG   Radiology No results found.  Procedures Procedures (including critical care time)  Medications Ordered in UC Medications - No data to display  Initial Impression / Assessment and Plan / UC Course  I have  reviewed the triage vital signs and the nursing notes.  Pertinent labs & imaging results that were available during my care of the patient were reviewed by me and considered in my medical decision making (see chart for details).    Prednisone as directed.  Continue muscle relaxer as needed.  Provided stretching exercises to help with symptoms.  To follow-up with sports medicine if symptoms not improving.  Return precautions given.  Final Clinical Impressions(s) / UC Diagnoses   Final diagnoses:  Strain of left trapezius muscle, initial encounter    ED Prescriptions    Medication Sig Dispense Auth. Provider   predniSONE (DELTASONE) 50 MG tablet Take 1 tablet (50 mg total) by mouth daily with breakfast. 5 tablet Analilia Geddis V, PA-C   tiZANidine (ZANAFLEX) 4 MG tablet Take 1 tablet (4 mg total) by mouth every 8 (eight) hours as needed. 15 tablet Belinda Fisher, PA-C     PDMP not reviewed this encounter.   Belinda Fisher, PA-C 05/10/20 1812

## 2020-05-10 NOTE — ED Triage Notes (Signed)
Pt c/o neck pain radiating to lt shoulder for the past few days. States pulled a muscle a few months ago.

## 2020-05-10 NOTE — Discharge Instructions (Signed)
Prednisone as directed. Can continue tizanidine as needed. Attached is some stretching exercise for the trapezius muscle, may help improve symptoms as well. Follow up with sports medicine if symptoms continue, does not resolve.

## 2020-06-23 ENCOUNTER — Ambulatory Visit
Admission: EM | Admit: 2020-06-23 | Discharge: 2020-06-23 | Disposition: A | Discharge status: Home / Self Care | Payer: 59 | Attending: Family | Admitting: Family

## 2020-06-23 DIAGNOSIS — G8929 Other chronic pain: Secondary | ICD-10-CM

## 2020-06-23 DIAGNOSIS — H00015 Hordeolum externum left lower eyelid: Secondary | ICD-10-CM

## 2020-06-23 DIAGNOSIS — S46812S Strain of other muscles, fascia and tendons at shoulder and upper arm level, left arm, sequela: Secondary | ICD-10-CM | POA: Diagnosis not present

## 2020-06-23 DIAGNOSIS — M542 Cervicalgia: Secondary | ICD-10-CM | POA: Diagnosis not present

## 2020-06-23 DIAGNOSIS — I10 Essential (primary) hypertension: Secondary | ICD-10-CM

## 2020-06-23 MED ORDER — AMLODIPINE BESYLATE 5 MG PO TABS: 5.0000 mg | ORAL_TABLET | Freq: Every day | ORAL | 1 refills | Status: DC

## 2020-06-23 MED ORDER — CYCLOBENZAPRINE HCL 10 MG PO TABS
ORAL_TABLET | ORAL | 0 refills | Status: DC
Start: 2020-06-23 — End: 2020-11-01

## 2020-06-23 MED ORDER — KETOROLAC TROMETHAMINE 60 MG/2ML IM SOLN
60.0000 mg | Freq: Once | INTRAMUSCULAR | Status: AC
  Administered 2020-06-23: 60 mg via INTRAMUSCULAR

## 2020-06-23 NOTE — ED Triage Notes (Signed)
Pt c/o lt shoulder pain radiating up lt neck and face. States has been here for same in past. States this has be going on for over a year. Pt states does heavy lifting at work. Denies injury.

## 2020-06-23 NOTE — ED Notes (Signed)
No answer from lobby  

## 2020-06-23 NOTE — Discharge Instructions (Signed)
You were given a shot of Toradol (anti-inflammatory) today to help with pain and inflammation. We were not able to give you steroids today due to recurrent use and your blood pressure is too elevated. Recommend stop Zanaflex and may try Flexeril 10mg  tablets- take 1/2 to 1 whole tablet every 12 hours as needed for muscle spasms/pain. Recommend continue Voltaren gel- apply up to 4 times a day as needed. Restart blood pressure medication- Amlodipine 5mg  daily and continue to monitor blood pressure. Recommend call or go to [facility] for further evaluation and follow-up with your PCP for continued management of blood pressure.

## 2020-06-23 NOTE — ED Provider Notes (Signed)
[name]    CSN: [id number] Arrival date & time: 06/23/20  1247      History   Chief Complaint Chief Complaint  Patient presents with   Shoulder Pain    HPI Ricardo [name] is a 39 y.o. male.   39 year old male presents with recurrent left sided neck, shoulder and back pain that has been occurring for over 1 year. Has come to Urgent Care on 05/2019, 11/2019 and at least monthly since May 2021 for various presentations of the same chronic pain. He has been given Toradol IM in Sept, March, May (X2), and June as well as Steroid IM in June, July and August 2021. He has also been prescribed Mobic, Zanaflex, oral Prednisone, and Flexeril along with Voltaren gel for treatment. He has seen a different provider at each visit. It also has been noted multiple times that he needs to follow-up with a PCP and a Sports Medicine or Orthopedic provider in which he has not done yet. Today he is convinced that he has fluid in his left shoulder and neck which is traveling to his left side of his face and causing a "stye-like" mass on his left lower eyelid. This has appeared in the past 2 to 3 days. He denies any fever, difficulty swallowing, difficulty breathing or distinct numbness. His job involves heavy lifting and turning his head and he feels this is contributing and aggravating his symptoms. Other chronic health issues include HTN and irritable bowel syndrome. Was on Norvasc 5mg  daily but has not taken any medication for the past 2 days. Only has 2 or 3 pills left- requests refill until he can see a PCP.   The history is provided by the patient.    History reviewed. No pertinent past medical history.  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 No pertinent surgical history.     Home Medications    Prior to Admission medications   Medication Sig Start Date End Date  Taking? Authorizing Provider  amLODipine (NORVASC) 5 MG tablet Take 1 tablet (5 mg total) by mouth daily. 06/23/20   Sudie GrumblingAmyot, Pearle Wandler Berry, NP  cyclobenzaprine (FLEXERIL) 10 MG tablet Take 1/2 to 1 whole tablet by mouth every 12 hours as needed for muscle spasms. 06/23/20   Sudie GrumblingAmyot, Merisa Julio Berry, NP  diclofenac Sodium (VOLTAREN) 1 % GEL Apply 2 g topically 4 (four) times daily. 03/31/20   Hall-Potvin, [place], PA-C    Family History Family History  Problem Relation Age of Onset   Irritable bowel syndrome Sister    Diabetes Sister    Hyperlipidemia Sister    Hypertension Sister    Hypertension Mother    Hypertension Father    Hyperlipidemia Father     Social History Social History   Tobacco Use   Smoking status: Former Smoker   Smokeless tobacco: Never Used  Substance Use Topics   Alcohol use: No    Alcohol/week: 0.0 standard drinks   Drug use: No     Allergies   Patient has no known allergies.   Review of Systems Review of Systems  Constitutional: Negative for activity change, appetite change, chills, fatigue and fever.  HENT: Positive for facial swelling (left lower eyelid swelling). Negative for congestion, postnasal drip, rhinorrhea, sinus pressure, sinus pain, sore throat and trouble swallowing.   Eyes: Negative for photophobia, pain, discharge, redness, itching and visual disturbance.  Respiratory: Negative for cough, chest tightness and shortness of breath.   Genitourinary: Negative for difficulty urinating, dysuria, flank pain and hematuria.  Musculoskeletal: Positive for arthralgias, back pain, myalgias and neck pain. Negative for neck stiffness.  Skin: Positive for color change. Negative for rash.  Allergic/Immunologic: Negative for environmental allergies and food allergies.  Neurological: Positive for headaches. Negative for dizziness, tremors, seizures, syncope, speech difficulty, weakness and light-headedness.  Hematological: Does not bruise/bleed easily.      Physical Exam Triage Vital Signs ED Triage Vitals  Enc Vitals Group     BP 06/23/20 1323 (!) 141/103     Pulse Rate 06/23/20 1323 94     Resp 06/23/20 1323 18     Temp 06/23/20 1323 98.5 F (36.9 C)     Temp Source 06/23/20 1323 Oral     SpO2 06/23/20 1323 96 %     Weight --      Height --      Head Circumference --      Peak Flow --      Pain Score 06/23/20 1324 8     Pain Loc --      Pain Edu? --      Excl. in GC? --    No data found.  Updated Vital Signs BP (!) 141/103 (BP Location: Left Arm) Comment: states did not take b/p meds today   Pulse 94    Temp 98.5 F (36.9 C) (Oral)    Resp 18    SpO2 96%   Visual Acuity Right Eye Distance:   Left Eye Distance:   Bilateral Distance:    Right Eye Near:   Left Eye Near:    Bilateral Near:     Physical Exam Vitals and nursing note reviewed.  Constitutional:      General: He is awake. He is not in acute distress.    Appearance: He is well-developed and well-groomed.     Comments: He is sitting on the exam table in no acute distress but appears in pain.   HENT:     Head: Normocephalic and atraumatic.     Jaw: There is normal jaw occlusion.     Right Ear: Hearing and external ear normal.     Left Ear: Hearing and external ear normal.     Nose: Nose normal.  Eyes:     General: Vision grossly intact. No visual field deficit.       Right eye: No discharge or hordeolum.        Left eye: Hordeolum present.No discharge.     Extraocular Movements: Extraocular movements intact.     Conjunctiva/sclera: Conjunctivae normal.     Pupils: Pupils are equal, round, and reactive to light.      Comments: Hard, round cyst/stye present on left lower central eyelid. Non-tender. No discharge or crusting seen. Conjunctiva is not red or irritated.   Neck:     Vascular: No carotid bruit or JVD.     Trachea: Trachea normal.      Comments: Has full range of motion of neck but with pain, especially with rotation. Swelling and muscle  spasms present on left upper trapezius area. No redness seen. No distinct neuro deficits noted.  Cardiovascular:     Rate and Rhythm: Normal rate and regular rhythm.     Heart sounds: Normal heart sounds. No murmur heard.   Pulmonary:     Effort: Pulmonary effort is normal. No respiratory distress.     Breath sounds: Normal breath  sounds and air entry. No decreased air movement. No decreased breath sounds, wheezing or rhonchi.  Musculoskeletal:        General: Swelling and tenderness present.     Left shoulder: Swelling and tenderness present. Normal range of motion. Normal strength. Normal pulse.       Arms:     Cervical back: Normal range of motion and neck supple. Tenderness present. No erythema or rigidity. Muscular tenderness present.  Skin:    General: Skin is warm and dry.     Capillary Refill: Capillary refill takes less than 2 seconds.  Neurological:     General: No focal deficit present.     Mental Status: He is alert and oriented to person, place, and time.     Sensory: Sensation is intact. No sensory deficit.     Motor: Motor function is intact.  Psychiatric:        Attention and Perception: Attention normal.        Mood and Affect: Mood normal.        Speech: Speech normal.        Behavior: Behavior is cooperative.     Comments: Appears irritated with having to continue to return here for evaluation.       UC Treatments / Results  Labs (all labs ordered are listed, but only abnormal results are displayed) Labs Reviewed - No data to display  EKG   Radiology No results found.  Procedures Procedures (including critical care time)  Medications Ordered in UC Medications  ketorolac (TORADOL) injection 60 mg (60 mg Intramuscular Given 06/23/20 1400)    Initial Impression / Assessment and Plan / UC Course  I have reviewed the triage vital signs and the nursing notes.  Pertinent labs & imaging results that were available during my care of the patient were  reviewed by me and considered in my medical decision making (see chart for details).    Reviewed with patient that he has a chronic pain issue that needs further evaluation and better management by one provider and Urgent Care is not the place for appropriate management of unstable chronic conditions. Discussed that I will not prescribe steroid medication for him today due to elevated blood pressure and concern over fluid retention- concern over Cushings like swelling around his neck which may be due to frequent use of steroids in the past 3 months. Will provide Toradol 60mg  IM now to help with inflammation but discussed he has had multiple injections of this medication over the past 6 to 9 months and is only a very short term fix and is not managing his underlying condition. Since he has visible and palpable muscle spasms, will retry Flexeril 10mg  1/2 to 1 tablet twice a day as directed (stop Zanaflex). May continue applying Voltaren gel to affected areas as needed. Discussed that lesion/cyst on left lower eyelid appears to be a stye but since it is not bothering him in terms of pain, recommend conservative treatment with warm compresses to area. Discussed that blood pressure is elevated and that he needs to take his medication. Restart Norvasc 5mg  daily- he needs to see his PCP for continued management. Provided information for [facility] for him to call today to schedule appointment for further evaluation of chronic neck, shoulder and back pain.  Final Clinical Impressions(s) / UC Diagnoses   Final diagnoses:  Chronic neck pain with normal neurological examination  Trapezius muscle strain, left, sequela  Hordeolum externum of left lower eyelid  Elevated blood pressure  reading in office with diagnosis of hypertension     Discharge Instructions     You were given a shot of Toradol (anti-inflammatory) today to help with pain and inflammation. We were not able to give you steroids today due to  recurrent use and your blood pressure is too elevated. Recommend stop Zanaflex and may try Flexeril 10mg  tablets- take 1/2 to 1 whole tablet every 12 hours as needed for muscle spasms/pain. Recommend continue Voltaren gel- apply up to 4 times a day as needed. Restart blood pressure medication- Amlodipine 5mg  daily and continue to monitor blood pressure. Recommend call or go to [facility] for further evaluation and follow-up with your PCP for continued management of blood pressure.     ED Prescriptions    Medication Sig Dispense Auth. Provider   amLODipine (NORVASC) 5 MG tablet Take 1 tablet (5 mg total) by mouth daily. 30 tablet , NP   cyclobenzaprine (FLEXERIL) 10 MG tablet Take 1/2 to 1 whole tablet by mouth every 12 hours as needed for muscle spasms. 20 tablet Dixie Jafri, , NP     PDMP not reviewed this encounter.   Sudie Grumbling, NP 06/23/20 2051

## 2020-07-07 DIAGNOSIS — M25512 Pain in left shoulder: Secondary | ICD-10-CM | POA: Insufficient documentation

## 2020-07-07 DIAGNOSIS — S46812A Strain of other muscles, fascia and tendons at shoulder and upper arm level, left arm, initial encounter: Secondary | ICD-10-CM | POA: Insufficient documentation

## 2020-07-07 DIAGNOSIS — M542 Cervicalgia: Secondary | ICD-10-CM | POA: Insufficient documentation

## 2020-09-29 ENCOUNTER — Other Ambulatory Visit: Payer: 59

## 2020-10-31 ENCOUNTER — Encounter: Payer: Self-pay | Admitting: Emergency Medicine

## 2020-10-31 ENCOUNTER — Ambulatory Visit
Admission: EM | Admit: 2020-10-31 | Discharge: 2020-10-31 | Disposition: A | Discharge status: Home / Self Care | Payer: 59 | Attending: Family Medicine | Admitting: Family Medicine

## 2020-10-31 ENCOUNTER — Other Ambulatory Visit: Payer: Self-pay

## 2020-10-31 DIAGNOSIS — K115 Sialolithiasis: Secondary | ICD-10-CM

## 2020-10-31 MED ORDER — PREDNISONE 20 MG PO TABS
40.0000 mg | ORAL_TABLET | Freq: Every day | ORAL | 0 refills | Status: DC
Start: 2020-10-31 — End: 2021-03-22

## 2020-10-31 MED ORDER — CEPHALEXIN 500 MG PO CAPS: 500.0000 mg | ORAL_CAPSULE | Freq: Three times a day (TID) | ORAL | 0 refills | Status: AC

## 2020-10-31 NOTE — ED Provider Notes (Signed)
[name]    CSN: [id number] Arrival date & time: 10/31/20  1257      History   Chief Complaint Chief Complaint  Patient presents with  . Lymphadenopathy    HPI Ricardo [name] is a 40 y.o. male.   HPI  Patient presents today for treatment of a recurrent right blocked submandibular gland infection.  Patient has been seen for this multiple times and is followed by ENT at [facility].  Reviewed recent EMR notes from last visit 08/14/2019 and patient was treated with Keflex and short course of steroid and he reported he has not had to have any treatment since 2020.  He is requesting treatment today as he has a appointment scheduled for follow-up  History reviewed. No pertinent past medical history.  Patient Active Problem List   Diagnosis Date Noted  . 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 Irritable bowel syndrome 07/09/2012    History reviewed. No pertinent surgical history.     Home Medications    Prior to Admission medications   Medication Sig Start Date End Date Taking? Authorizing Provider  cephALEXin (KEFLEX) 500 MG capsule Take 1 capsule (500 mg total) by mouth 3 (three) times daily for 14 days. 10/31/20 11/14/20 Yes Bing Neighbors, FNP  predniSONE (DELTASONE) 20 MG tablet Take 2 tablets (40 mg total) by mouth daily with breakfast. 10/31/20  Yes Bing Neighbors, FNP  amLODipine (NORVASC) 5 MG tablet Take 1 tablet (5 mg total) by mouth daily. 06/23/20   Sudie Grumbling, NP  cyclobenzaprine (FLEXERIL) 10 MG tablet Take 1/2 to 1 whole tablet by mouth every 12 hours as needed for muscle spasms. 06/23/20   Sudie Grumbling, NP  diclofenac Sodium (VOLTAREN) 1 % GEL Apply 2 g topically 4 (four) times daily. 03/31/20   Hall-Potvin, [place], PA-C    Family History Family History  Problem Relation Age of Onset  . Irritable bowel syndrome Sister   . Diabetes Sister   . Hyperlipidemia Sister   . Hypertension Sister   . Hypertension Mother   . Hypertension  Father   . Hyperlipidemia Father     Social History Social History   Tobacco Use  . Smoking status: Former Games developer  . Smokeless tobacco: Never Used  Substance Use Topics  . Alcohol use: No    Alcohol/week: 0.0 standard drinks  . Drug use: No     Allergies   Patient has no known allergies.   Review of Systems Review of Systems Pertinent negatives listed in HPI   Physical Exam Triage Vital Signs ED Triage Vitals  Enc Vitals Group     BP 10/31/20 1410 (!) 156/99     Pulse Rate 10/31/20 1410 82     Resp 10/31/20 1410 18     Temp 10/31/20 1410 98.3 F (36.8 C)     Temp Source 10/31/20 1410 Oral     SpO2 10/31/20 1410 98 %     Weight --      Height --      Head Circumference --      Peak Flow --      Pain Score 10/31/20 1411 3     Pain Loc --      Pain Edu? --      Excl. in GC? --    No data found.  Updated Vital Signs BP (!) 156/99 (BP Location: Left Arm)   Pulse 82   Temp 98.3 F (36.8 C) (Oral)   Resp 18  SpO2 98%   Visual Acuity Right Eye Distance:   Left Eye Distance:   Bilateral Distance:    Right Eye Near:   Left Eye Near:    Bilateral Near:     Physical Exam HENT:     Head: Normocephalic.  Neck:     Trachea: Trachea and phonation normal.   Cardiovascular:     Rate and Rhythm: Normal rate and regular rhythm.  Pulmonary:     Effort: Pulmonary effort is normal.     Breath sounds: Normal breath sounds.  Lymphadenopathy:     Cervical: Cervical adenopathy present.  Skin:    General: Skin is warm.  Neurological:     General: No focal deficit present.     Mental Status: He is alert.  Psychiatric:        Mood and Affect: Mood normal.        Behavior: Behavior normal.        Thought Content: Thought content normal.        Judgment: Judgment normal.      UC Treatments / Results  Labs (all labs ordered are listed, but only abnormal results are displayed) Labs Reviewed - No data to display  EKG   Radiology No results  found.  Procedures Procedures (including critical care time)  Medications Ordered in UC Medications - No data to display  Initial Impression / Assessment and Plan / UC Course  I have reviewed the triage vital signs and the nursing notes.  Pertinent labs & imaging results that were available during my care of the patient were reviewed by me and considered in my medical decision making (see chart for details).    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  He has a follow-up appointment scheduled at the end of February and is requesting treatment until his scheduled follow-up.  Reviewed prior EMR notes and patient was treated previously successfully with Keflex and prednisone.  Red flags discussed with patient advised to keep appointment with his or otolaryngologist.  Patient verbalized understanding and agreement with plan. Final Clinical Impressions(s) / UC Diagnoses   Final diagnoses:  Sialolithiasis of submandibular gland   Discharge Instructions   None    ED Prescriptions    Medication Sig Dispense Auth. Provider   cephALEXin (KEFLEX) 500 MG capsule Take 1 capsule (500 mg total) by mouth 3 (three) times daily for 14 days. 42 capsule Bing Neighbors, FNP   predniSONE (DELTASONE) 20 MG tablet Take 2 tablets (40 mg total) by mouth daily with breakfast. 10 tablet Bing Neighbors, FNP     PDMP not reviewed this encounter.   Bing Neighbors, [place] 11/02/20 [phone]

## 2020-10-31 NOTE — ED Triage Notes (Signed)
Pt here for swelling to gland under right jaw; pt sts hx of same and usually needs antibiotics and steroids

## 2020-11-01 ENCOUNTER — Ambulatory Visit (INDEPENDENT_AMBULATORY_CARE_PROVIDER_SITE_OTHER): Payer: 59 | Admitting: Neurology

## 2020-11-01 ENCOUNTER — Other Ambulatory Visit: Payer: Self-pay | Admitting: *Deleted

## 2020-11-01 ENCOUNTER — Encounter: Payer: Self-pay | Admitting: Neurology

## 2020-11-01 VITALS — BP 157/106 | HR 78 | Ht 67.0 in | Wt 211.5 lb

## 2020-11-01 DIAGNOSIS — M542 Cervicalgia: Secondary | ICD-10-CM

## 2020-11-01 MED ORDER — MELOXICAM 15 MG PO TABS: 15.0000 mg | ORAL_TABLET | Freq: Every day | ORAL | 3 refills | Status: DC | PRN

## 2020-11-01 MED ORDER — MELOXICAM 15 MG PO TABS
15.0000 mg | ORAL_TABLET | ORAL | 3 refills | Status: DC | PRN
Start: 2020-11-01 — End: 2020-11-01

## 2020-11-01 MED ORDER — TIZANIDINE HCL 4 MG PO TABS
4.0000 mg | ORAL_TABLET | Freq: Four times a day (QID) | ORAL | 2 refills | Status: DC | PRN
Start: 2020-11-01 — End: 2021-03-01
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
## 2020-11-02 ENCOUNTER — Telehealth: Payer: Self-pay | Admitting: Neurology

## 2020-11-02 NOTE — Telephone Encounter (Signed)
Because of patient's insurance his PCP will have to send him to Chiropractic Medicine . I have called and left patient a message and relayed he can call me back with any questions . Thanks Annabelle Harman

## 2021-02-25 IMAGING — CR DG SHOULDER 2+V*L*
3 series · 3 of 3 positions shown · non-contrast
Comparison: 04/07/2019

CLINICAL DATA: Left shoulder and arm pain for 3-4 days.  No injury.

EXAM:
LEFT SHOULDER - 2+ VIEW

[shoulder grashey]
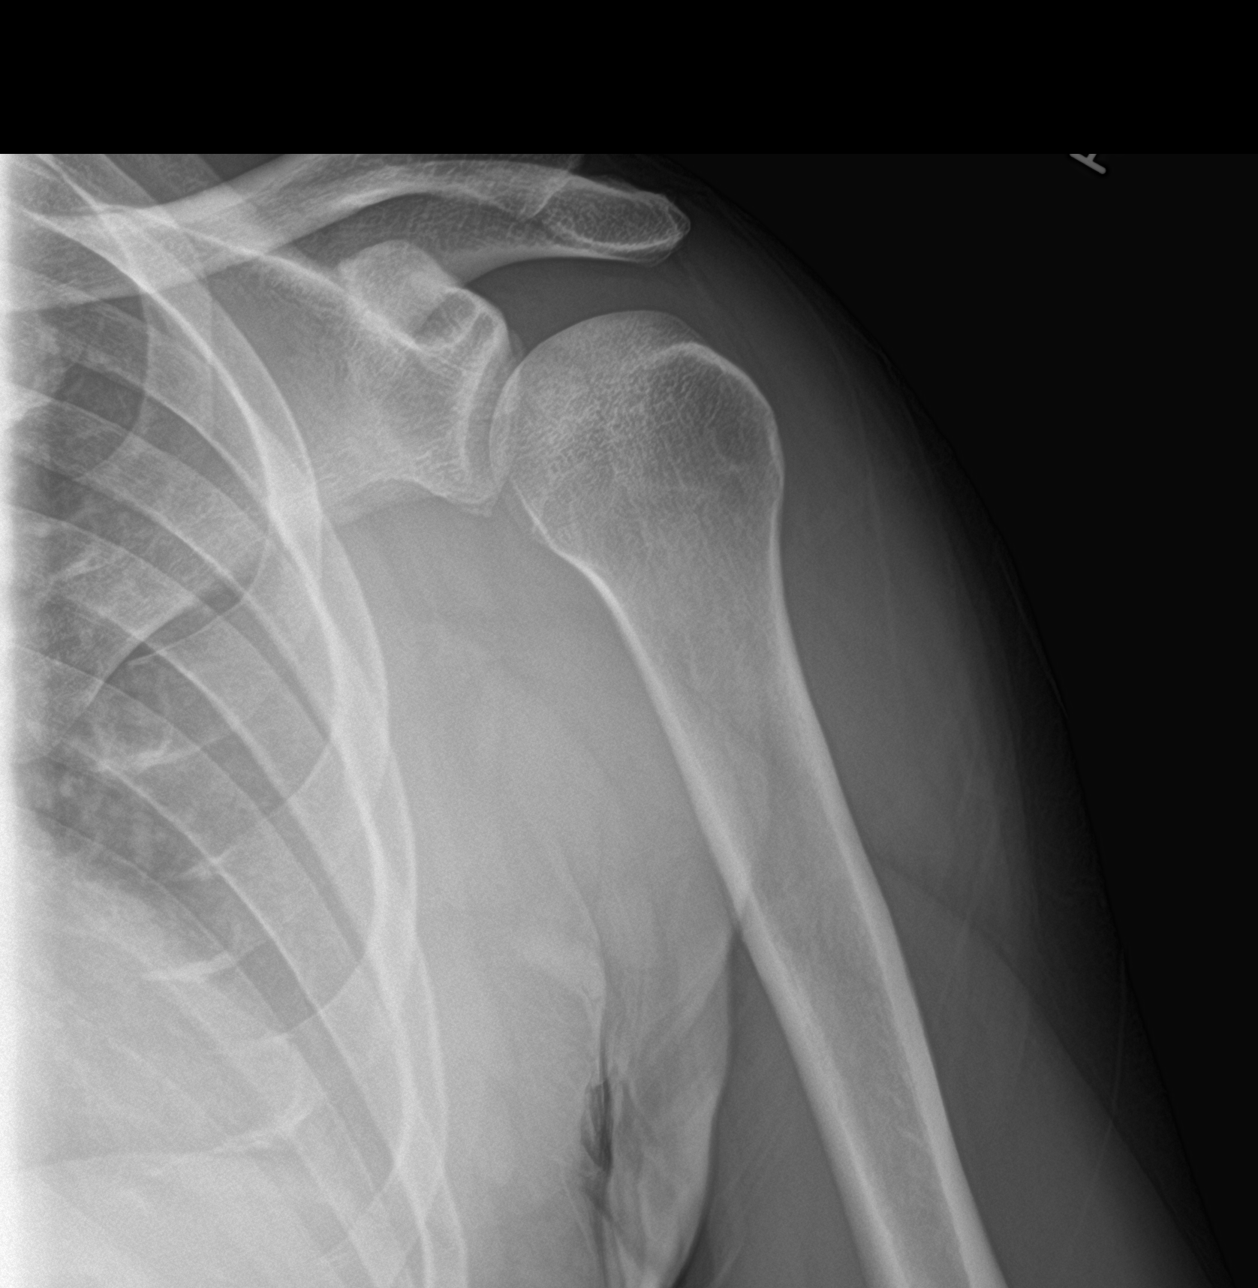

[shoulder y view (1 of 2)]
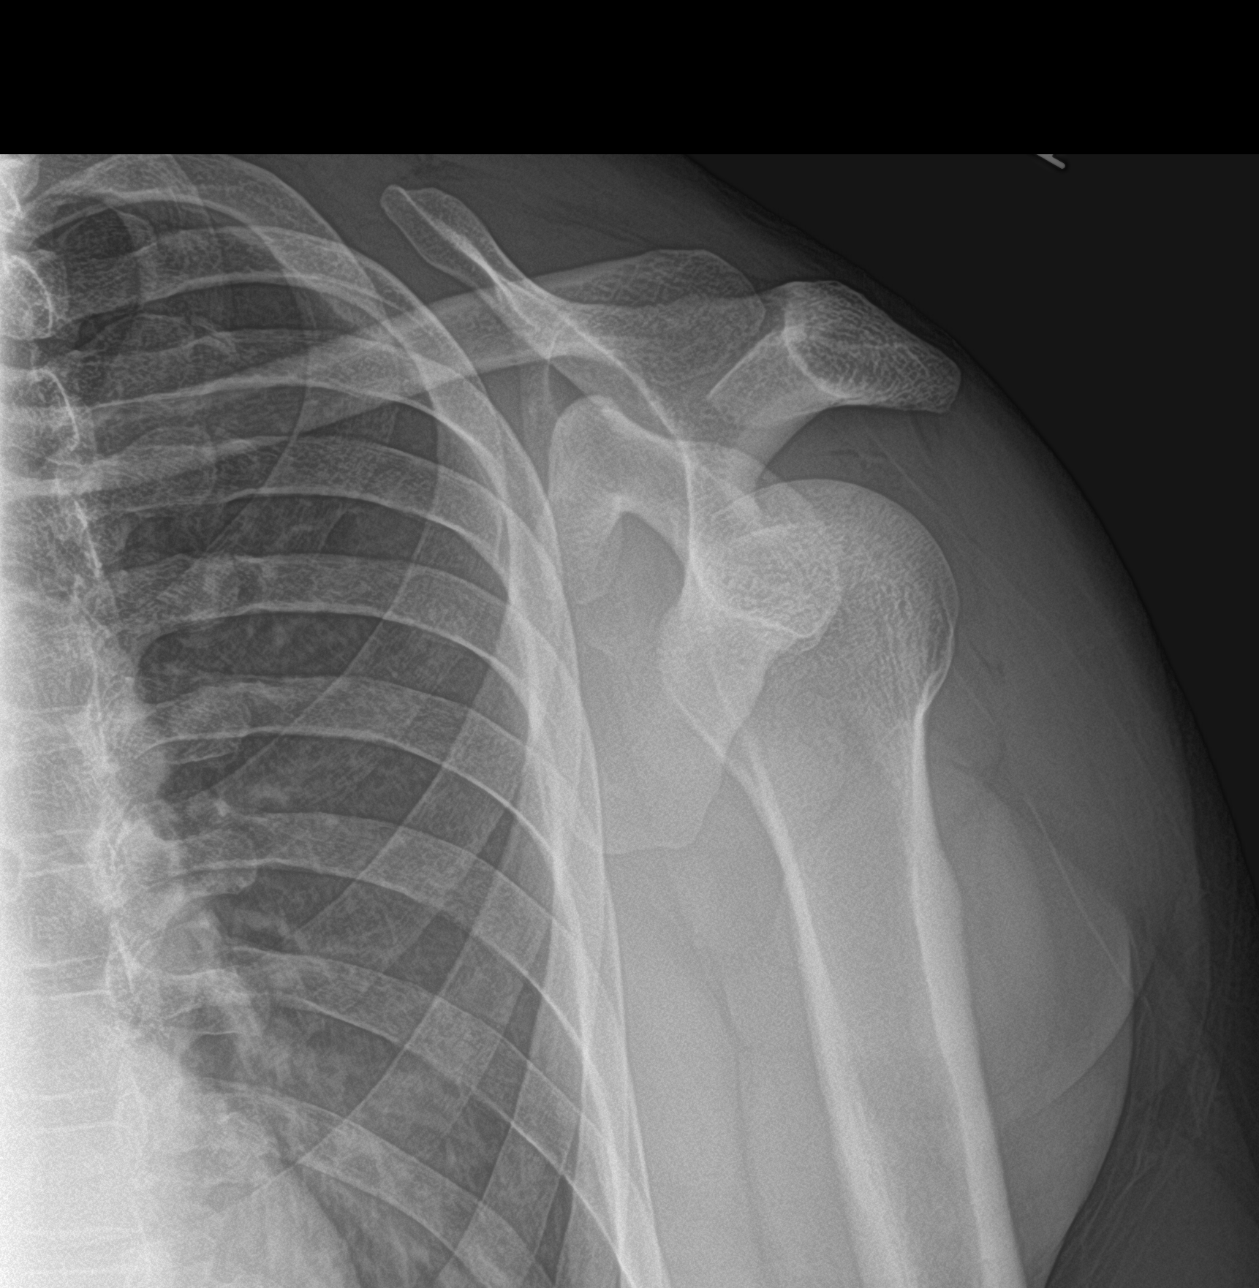

[shoulder y view (2 of 2)]
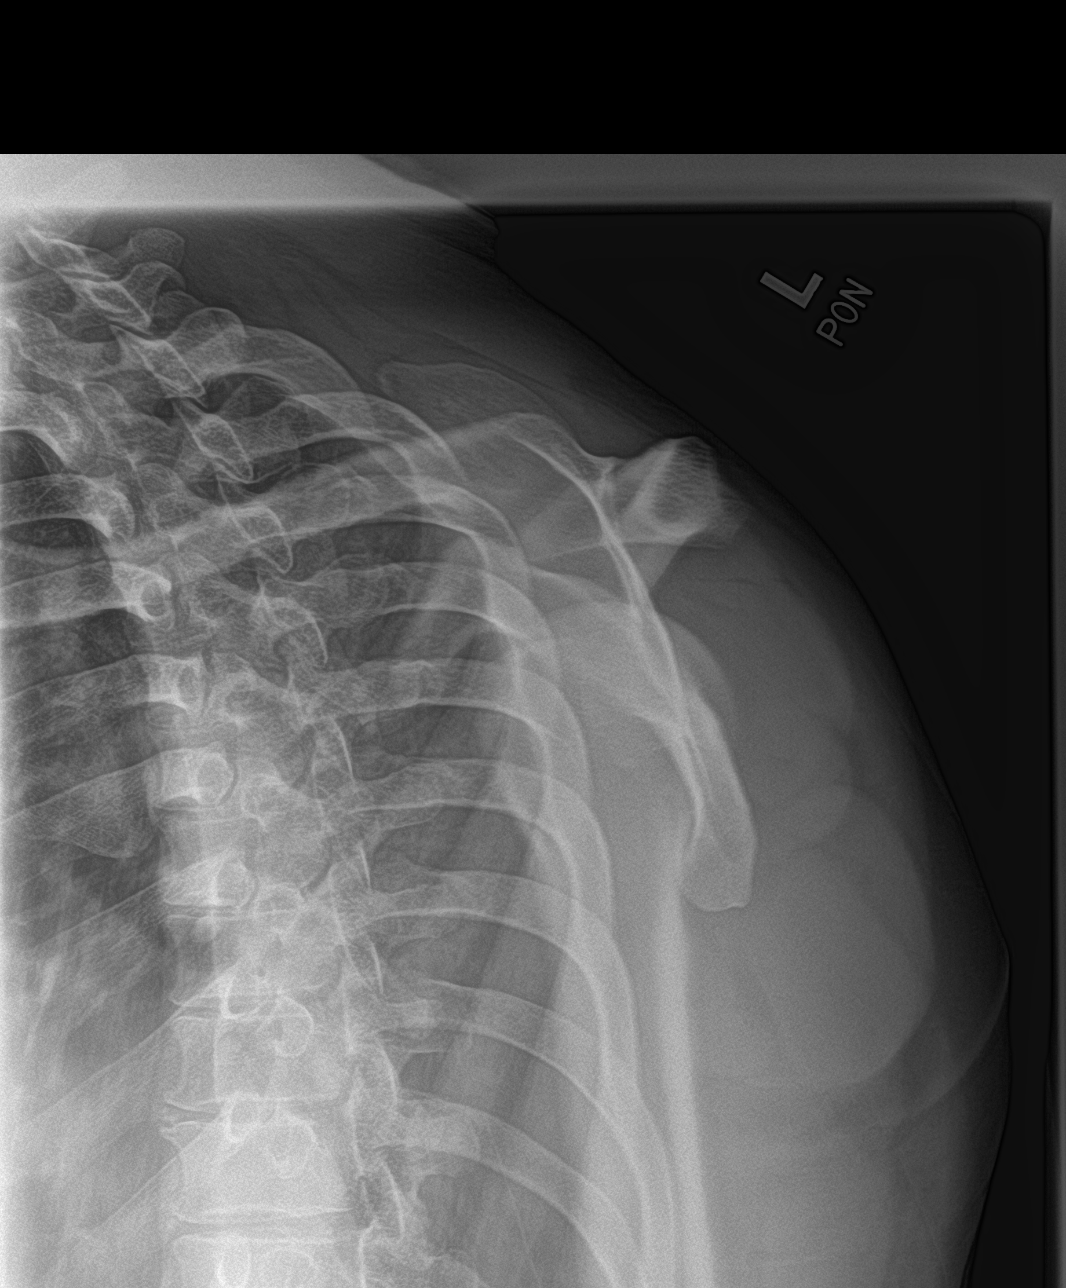

[3 of 3 positions shown; findings below may reference images not displayed]

FINDINGS: There is no evidence of fracture or dislocation. There is no
evidence of arthropathy or other focal bone abnormality. Soft
tissues are unremarkable.
IMPRESSION: Negative.

## 2021-03-01 ENCOUNTER — Encounter: Payer: Self-pay | Admitting: Emergency Medicine

## 2021-03-01 ENCOUNTER — Other Ambulatory Visit: Payer: Self-pay

## 2021-03-01 ENCOUNTER — Ambulatory Visit
Admission: EM | Admit: 2021-03-01 | Discharge: 2021-03-01 | Disposition: A | Discharge status: Home / Self Care | Payer: 59 | Attending: Student | Admitting: Student

## 2021-03-01 DIAGNOSIS — S46812A Strain of other muscles, fascia and tendons at shoulder and upper arm level, left arm, initial encounter: Secondary | ICD-10-CM | POA: Diagnosis not present

## 2021-03-01 MED ORDER — TIZANIDINE HCL 2 MG PO CAPS: 2.0000 mg | ORAL_CAPSULE | Freq: Three times a day (TID) | ORAL | 0 refills | Status: DC

## 2021-03-01 MED ORDER — KETOROLAC TROMETHAMINE 30 MG/ML IJ SOLN
30.0000 mg | Freq: Once | INTRAMUSCULAR | Status: AC
  Administered 2021-03-01: 30 mg via INTRAMUSCULAR

## 2021-03-01 NOTE — Discharge Instructions (Addendum)
-  Start the muscle relaxer-Zanaflex (tizanidine), up to 3 times daily for muscle spasms and pain.  This can make you drowsy, so take at bedtime or when you do not need to drive or operate machinery. -Follow-up with orthopedist for additional management -Avoid ibuprofen for the rest of today since we administered the Toradol today.

## 2021-03-01 NOTE — ED Triage Notes (Signed)
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

## 2021-03-01 NOTE — ED Provider Notes (Signed)
[name]    CSN: [id number] Arrival date & time: 03/01/21  0920      History   Chief Complaint Chief Complaint  Patient presents with  . Shoulder Pain    HPI Ricardo [name] is a 40 y.o. male presenting with L shoulder pain. This is a chronic issue for him. States he's been working a lot, with pulling and heavy lifting. 3 days of the L shoulder pain. States he needs refill on the muscle relaxer. Has also followed with chiropractor and ortho in the past. Denies new trauma. Denies weakness or sensation changes in arms or legs, denies pain shooting down arms or legs. Denies pain shooting down legs, denies numbness in arms/legs, denies weakness in arms/legs, denies saddle anesthesia, denies bowel/bladder incontinence.    HPI  Past Medical History:  Diagnosis Date  . Facial pain   . Hypertension   . Neck pain   . Swollen gland    swells intermittently - neck    Patient Active Problem List   Diagnosis Date Noted  . Neck pain on left side 11/01/2020  . 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 Irritable bowel syndrome 07/09/2012    Past Surgical History:  Procedure Laterality Date  . no past surgery         Home Medications    Prior to Admission medications   Medication Sig Start Date End Date Taking? Authorizing Provider  tizanidine (ZANAFLEX) 2 MG capsule Take 1 capsule (2 mg total) by mouth 3 (three) times daily. 03/01/21  Yes Rhys Martini, PA-C  diclofenac Sodium (VOLTAREN) 1 % GEL Apply 2 g topically 4 (four) times daily. 03/31/20   Hall-Potvin, [place], PA-C  gabapentin (NEURONTIN) 300 MG capsule Take 300 mg by mouth 3 (three) times daily as needed. 08/17/20   [provider]  losartan-hydrochlorothiazide (HYZAAR) 50-12.5 MG tablet Take 1 tablet by mouth daily. 07/14/20   [provider]  meloxicam (MOBIC) 15 MG tablet Take 1 tablet (15 mg total) by mouth daily as needed for pain. 11/01/20   Levert Feinstein, MD  methocarbamol (ROBAXIN) 500  MG tablet Take 500 mg by mouth 3 (three) times daily as needed. 09/01/20   [provider]  predniSONE (DELTASONE) 20 MG tablet Take 2 tablets (40 mg total) by mouth daily with breakfast. 10/31/20   Bing Neighbors, FNP    Family History Family History  Problem Relation Age of Onset  . Irritable bowel syndrome Sister   . Diabetes Sister   . Hyperlipidemia Sister   . Hypertension Sister   . Hypertension Mother   . Hypertension Father   . Hyperlipidemia Father     Social History Social History   Tobacco Use  . Smoking status: Current Some Day Smoker    Types: Cigars  . Smokeless tobacco: Never Used  Substance Use Topics  . Alcohol use: Yes    Alcohol/week: 0.0 standard drinks    Comment: occasional use  . Drug use: No     Allergies   Patient has no known allergies.   Review of Systems Review of Systems  Musculoskeletal:       L shoulder pain  All other systems reviewed and are negative.    Physical Exam Triage Vital Signs ED Triage Vitals  Enc Vitals Group     BP 03/01/21 0944 (!) 154/105     Pulse Rate 03/01/21 0944 96     Resp 03/01/21 0944 16     Temp 03/01/21 0944 97.6 F (  36.4 C)     Temp Source 03/01/21 0944 Oral     SpO2 03/01/21 0944 97 %     Weight --      Height --      Head Circumference --      Peak Flow --      Pain Score 03/01/21 0943 6     Pain Loc --      Pain Edu? --      Excl. in GC? --    No data found.  Updated Vital Signs BP (!) 154/105 (BP Location: Right Arm)   Pulse 96   Temp 97.6 F (36.4 C) (Oral)   Resp 16   SpO2 97%   Visual Acuity Right Eye Distance:   Left Eye Distance:   Bilateral Distance:    Right Eye Near:   Left Eye Near:    Bilateral Near:     Physical Exam Vitals reviewed.  Constitutional:      General: He is not in acute distress.    Appearance: Normal appearance. He is not ill-appearing or diaphoretic.  HENT:     Head: Normocephalic and atraumatic.  Eyes:     Extraocular Movements:  Extraocular movements intact.     Pupils: Pupils are equal, round, and reactive to light.  Cardiovascular:     Rate and Rhythm: Normal rate and regular rhythm.     Heart sounds: Normal heart sounds.  Pulmonary:     Effort: Pulmonary effort is normal.     Breath sounds: Normal breath sounds.  Musculoskeletal:     Comments: L proximal trapezius- TTP. Some L cervical paraspinous muscle tenderness to deep palpation. ROM neck intact but with minimal pain. ROM L arm intact and without pain. Negative empty beer, hawkins, neer. Grip strength 5/5, radial pulse 2+. Sensation intact. No spinous tenderness, deformity, stepoff.  Skin:    General: Skin is warm.     Capillary Refill: Capillary refill takes less than 2 seconds.  Neurological:     General: No focal deficit present.     Mental Status: He is alert and oriented to person, place, and time.  Psychiatric:        Mood and Affect: Mood normal.        Behavior: Behavior normal.        Thought Content: Thought content normal.        Judgment: Judgment normal.      UC Treatments / Results  Labs (all labs ordered are listed, but only abnormal results are displayed) Labs Reviewed - No data to display  EKG   Radiology No results found.  Procedures Procedures (including critical care time)  Medications Ordered in UC Medications  ketorolac (TORADOL) 30 MG/ML injection 30 mg (30 mg Intramuscular Given 03/01/21 1011)    Initial Impression / Assessment and Plan / UC Course  I have reviewed the triage vital signs and the nursing notes.  Pertinent labs & imaging results that were available during my care of the patient were reviewed by me and considered in my medical decision making (see chart for details).     This patient is a 40 year old male presenting with L trapezius strain. No red flag symptoms. Toradol administered at patient request. zanaflex refilled. Follow-up with ortho. ED return precautions discussed.   Final Clinical  Impressions(s) / UC Diagnoses   Final diagnoses:  Trapezius strain, left, initial encounter     Discharge Instructions     -Start the muscle relaxer-Zanaflex (tizanidine), up to 3 times  daily for muscle spasms and pain.  This can make you drowsy, so take at bedtime or when you do not need to drive or operate machinery. -Follow-up with orthopedist for additional management -Avoid ibuprofen for the rest of today since we administered the Toradol today.    ED Prescriptions    Medication Sig Dispense Auth. Provider   tizanidine (ZANAFLEX) 2 MG capsule Take 1 capsule (2 mg total) by mouth 3 (three) times daily. 21 capsule Rhys Martini, PA-C     PDMP not reviewed this encounter.   Rhys Martini, PA-C 03/01/21 1045

## 2021-03-20 ENCOUNTER — Ambulatory Visit (HOSPITAL_COMMUNITY): Payer: Self-pay | Admitting: General Surgery

## 2021-03-20 NOTE — H&P (Signed)
The patient is a 40 year old male who presents with a complaint of anal problems. 40 year old male who presents to the office for evaluation of a perianal lesion.  He states this is been present for several months.  He saw his dermatologist about this and was diagnosed with anal warts.  Cryoablation has been attempted on 2 occasions with continued recurrence.  He is here today for evaluation and further treatment Past Medical History:  Diagnosis Date   Facial pain    Hypertension    Neck pain    Swollen gland    swells intermittently - neck   Past Surgical History:  Procedure Laterality Date   no past surgery     Family History  Problem Relation Age of Onset   Irritable bowel syndrome Sister    Diabetes Sister    Hyperlipidemia Sister    Hypertension Sister    Hypertension Mother    Hypertension Father    Hyperlipidemia Father    Social History   Socioeconomic History   Marital status: Single    Spouse name: Not on file   Number of children: 3   Years of education: 12   Highest education level: High school graduate  Occupational History   Occupation: owns Regulatory affairs officer  Tobacco Use   Smoking status: Some Days    Pack years: 0.00    Types: Cigars   Smokeless tobacco: Never  Substance and Sexual Activity   Alcohol use: Yes    Alcohol/week: 0.0 standard drinks    Comment: occasional use   Drug use: No   Sexual activity: Not on file  Other Topics Concern   Not on file  Social History Narrative   Lives with parents.   Right-handed.   No daily use of caffeine.   Social Determinants of Health   Financial Resource Strain: Not on file  Food Insecurity: Not on file  Transportation Needs: Not on file  Physical Activity: Not on file  Stress: Not on file  Social Connections: Not on file  Intimate Partner Violence: Not on file     Allergies Marliss Coots, CNA; 03/20/2021 10:46 AM) Latex Exam Gloves *MEDICAL DEVICES AND SUPPLIES*  Allergies Reconciled    Medication History Marliss Coots, CNA; 03/20/2021 10:46 AM) amLODIPine Besylate  (5MG  Tablet, Oral) Active. Cephalexin  (250MG  Capsule, Oral) Active. Cephalexin  (500MG  Capsule, Oral) Active. Cyclobenzaprine HCl  (10MG  Tablet, Oral) Active. Diclofenac Sodium  (1% Gel, External) Active. Doxycycline Hyclate  (100MG  Capsule, Oral) Active. Gabapentin  (300MG  Capsule, Oral) Active. Imiquimod  (5% Cream, External) Active. Losartan Potassium-HCTZ  (50-12.5MG  Tablet, Oral) Active. Meloxicam  (15MG  Tablet, Oral) Active. Methocarbamol  (500MG  Tablet, Oral) Active. predniSONE  (10MG  Tablet, Oral) Active. tiZANidine HCl  (4MG  Tablet, Oral) Active. Medications Reconciled   Review of Systems - General ROS: negative Respiratory ROS: no cough, shortness of breath, or wheezing Cardiovascular ROS: no chest pain or dyspnea on exertion Gastrointestinal ROS: no abdominal pain, change in bowel habits, or black or bloody stools Genito-Urinary ROS: no dysuria, trouble voiding, or hematuria    Vitals Alston CNA; 03/20/2021 10:47 AM) 03/20/2021 10:46 AM Weight: 215.25 lb   Height: 67 in  Body Surface Area: 2.09 m   Body Mass Index: 33.71 kg/m   Temp.: 98.2 F    Pulse: 107 (Regular)    P.OX: 96% (Room air) BP: 160/90(Sitting, Left Arm, Standard)    Physical Exam   General Mental Status - Alert. General Appearance - Cooperative. CV: RRR Lungs: CTA Abd:  soft Rectal Anorectal Exam External - Note: Multiple condylomatous lesions noted. A posterior midline externally and progressing into the internal anal canal.    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 He has tried several treatments of cryotherapy at his dermatologist office. These have been unsuccessful. He presents today for further evaluation. He appears to have some lesions noted at posterior  midline. I have recommended laser ablation in the operating room. We have discussed this in detail including postoperative pain, and recurrence rates. We discussed the management of anal warts. We discussed chemical destruction, immunotherapy, and surgical excision. I discussed the pros and cons of each approach. We discussed the risk and benefits and the expected outcome with chemical destruction with agents such as podophyllin. I explained that podophyllin is generally not been effective and has a high recurrence rate. We discussed the use of Aldara ointment. I explained that it has a 30-70% chance at resolving or at least reducing the number of anal warts. I explained that it is applied 3 times a week at night and left on overnight. I explained that skin irritation is the most common side effect. We then discussed surgical excision specifically excision and fulguration. I explained how the surgery is performed. I explained that it can be painful however it generally has the highest success rate. We discussed the risk and benefits of surgery including but not limited to bleeding, infection, injury to surrounding structures, need to do a formal anoscopic exam to evaluate for anal canal warts, urinary retention, wart recurrence, and general anesthesia risk. We discussed the typical aftercare.

## 2021-03-22 ENCOUNTER — Ambulatory Visit
Admission: EM | Admit: 2021-03-22 | Discharge: 2021-03-22 | Disposition: A | Discharge status: Home / Self Care | Payer: 59 | Attending: Internal Medicine | Admitting: Internal Medicine

## 2021-03-22 DIAGNOSIS — W503XXA Accidental bite by another person, initial encounter: Secondary | ICD-10-CM | POA: Diagnosis not present

## 2021-03-22 DIAGNOSIS — N4822 Cellulitis of corpus cavernosum and penis: Secondary | ICD-10-CM

## 2021-03-22 MED ORDER — CLOTRIMAZOLE 1 % EX CREA
TOPICAL_CREAM | CUTANEOUS | 0 refills | Status: DC
Start: 2021-03-22 — End: 2021-05-11

## 2021-03-22 MED ORDER — AMOXICILLIN-POT CLAVULANATE 875-125 MG PO TABS: 1.0000 | ORAL_TABLET | Freq: Two times a day (BID) | ORAL | 0 refills | Status: AC

## 2021-03-22 NOTE — ED Triage Notes (Signed)
Pt c/o irritation to foreskin on his penis for over a week. States given creams at the clinic last week with no relief.
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
## 2021-04-20 DIAGNOSIS — G501 Atypical facial pain: Secondary | ICD-10-CM | POA: Insufficient documentation

## 2021-04-20 DIAGNOSIS — G90519 Complex regional pain syndrome I of unspecified upper limb: Secondary | ICD-10-CM | POA: Insufficient documentation

## 2021-05-11 ENCOUNTER — Ambulatory Visit
Admission: EM | Admit: 2021-05-11 | Discharge: 2021-05-11 | Disposition: A | Discharge status: Home / Self Care | Payer: 59 | Attending: Internal Medicine | Admitting: Internal Medicine

## 2021-05-11 ENCOUNTER — Other Ambulatory Visit: Payer: Self-pay

## 2021-05-11 DIAGNOSIS — S46812A Strain of other muscles, fascia and tendons at shoulder and upper arm level, left arm, initial encounter: Secondary | ICD-10-CM | POA: Diagnosis not present

## 2021-05-11 DIAGNOSIS — M542 Cervicalgia: Secondary | ICD-10-CM

## 2021-05-11 MED ORDER — KETOROLAC TROMETHAMINE 60 MG/2ML IM SOLN
30.0000 mg | Freq: Once | INTRAMUSCULAR | Status: AC
  Administered 2021-05-11: 30 mg via INTRAMUSCULAR

## 2021-05-11 NOTE — ED Triage Notes (Signed)
Pt c/o lt neck muscle pain x2wks. States hx of same d/t heavy lifting at work. Denies injury.

## 2021-05-11 NOTE — Discharge Instructions (Addendum)
Unable to prescribe muscle relaxer today due to recent prescription contraindication. Please avoid taking any over the counter ibuprofen, advil, aleve for at least 24 hours following ketorolac injection. You may alternate ice and heat application to affected area of pain. Follow up with orthopedist for further evaluation and management.

## 2021-05-11 NOTE — ED Provider Notes (Addendum)
[name]    CSN: [id number] Arrival date & time: 05/11/21  0915      History   Chief Complaint Chief Complaint  Patient presents with   Neck Pain    HPI Ricardo [name] is a 40 y.o. male.   Patient presents to the urgent care for left sided neck pain that started approximately 2 weeks ago. States that he has chronic flareups of neck pain due to heavy lifting that is done at his workplace. Denies any numbness or tingling and pain does not radiate down left arm. Patient states that he is followed by [facility] for chronic neck pain. Patient is requesting ketorolac injection and refill on muscle relaxer today due to successful treatment of neck pain in the past with this medication regimen. Patient has not yet taken any OTC medications for symptoms.    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   Irritable bowel syndrome 07/09/2012    Past Surgical History:  Procedure Laterality Date   no past surgery         Home Medications    Prior to Admission medications   Not on File    Family History Family History  Problem Relation Age of Onset   Irritable bowel syndrome Sister    Diabetes Sister    Hyperlipidemia Sister    Hypertension Sister    Hypertension Mother    Hypertension Father    Hyperlipidemia Father     Social History Social History   Tobacco Use   Smoking status: Some Days    Types: Cigars   Smokeless tobacco: Never  Substance Use Topics   Alcohol use: Yes    Alcohol/week: 0.0 standard drinks    Comment: occasional use   Drug use: No     Allergies   Patient has no known allergies.   Review of Systems Review of Systems  Musculoskeletal:  Positive for neck pain.  Per HPI  Physical  Exam Triage Vital Signs ED Triage Vitals  Enc Vitals Group     BP 05/11/21 0936 (!) 163/108     Pulse Rate 05/11/21 0936 84     Resp 05/11/21 0936 18     Temp 05/11/21 0936 98.1 F (36.7 C)     Temp Source 05/11/21 0936 Oral     SpO2 05/11/21 0936 97 %     Weight --      Height --      Head Circumference --      Peak Flow --      Pain Score 05/11/21 0937 10     Pain Loc --      Pain Edu? --      Excl. in GC? --    No data found.  Updated Vital Signs BP (!) 163/108 (BP Location: Left Arm)   Pulse 84   Temp 98.1 F (36.7 C) (Oral)   Resp 18   SpO2 97%   Visual Acuity Right Eye Distance:   Left Eye Distance:   Bilateral Distance:    Right Eye Near:   Left Eye Near:    Bilateral Near:     Physical Exam Constitutional:      Appearance:  Normal appearance.  HENT:     Head: Normocephalic and atraumatic.  Eyes:     Extraocular Movements: Extraocular movements intact.     Conjunctiva/sclera: Conjunctivae normal.  Cardiovascular:     Rate and Rhythm: Normal rate and regular rhythm.     Pulses: Normal pulses.     Heart sounds: Normal heart sounds.  Pulmonary:     Effort: Pulmonary effort is normal.     Breath sounds: Normal breath sounds.  Musculoskeletal:     Cervical back: Full passive range of motion without pain and normal range of motion.     Comments: Tenderness to palpation to left neck and trapezius muscle that extends to left shoulder. Neurovascular intact.   Neurological:     General: No focal deficit present.     Mental Status: He is alert and oriented to person, place, and time. Mental status is at baseline.  Psychiatric:        Mood and Affect: Mood normal.        Behavior: Behavior normal.        Thought Content: Thought content normal.        Judgment: Judgment normal.     UC Treatments / Results  Labs (all labs ordered are listed, but only abnormal results are displayed) Labs Reviewed - No data to display  EKG   Radiology No results  found.  Procedures Procedures (including critical care time)  Medications Ordered in UC Medications  ketorolac (TORADOL) injection 30 mg (30 mg Intramuscular Given 05/11/21 1003)    Initial Impression / Assessment and Plan / UC Course  I have reviewed the triage vital signs and the nursing notes.  Pertinent labs & imaging results that were available during my care of the patient were reviewed by me and considered in my medical decision making (see chart for details).     Physical exam is most consistent with trapezius muscle strain. Ketorolac injection was administered in urgent care today for pain and inflammation. Explained to patient that refill on muscle relaxer can not be completed due to multiple and recent prescriptions and safety risk. Patient voiced understanding. Advised patient to follow up with orthopedist for further evaluation and management. Patient to alternate ice and heat application. Elevated blood pressure reading in urgent care today. BP reading is consistent with previous BP readings. No signs of hypertensive urgency. Discussed strict return precautions. Patient verbalized understanding and is agreeable with plan.  Final Clinical Impressions(s) / UC Diagnoses   Final diagnoses:  Trapezius strain, left, initial encounter  Neck pain     Discharge Instructions      Unable to prescribe muscle relaxer today due to recent prescription contraindication. Please avoid taking any over the counter ibuprofen, advil, aleve for at least 24 hours following ketorolac injection. You may alternate ice and heat application to affected area of pain. Follow up with orthopedist for further evaluation and management.      ED Prescriptions   None    PDMP not reviewed this encounter.   Lance Muss, FNP 05/11/21 1047    Lance Muss, FNP 05/11/21 1048

## 2021-05-18 DIAGNOSIS — R220 Localized swelling, mass and lump, head: Secondary | ICD-10-CM | POA: Insufficient documentation

## 2021-06-01 ENCOUNTER — Telehealth: Payer: Self-pay

## 2021-06-01 NOTE — Telephone Encounter (Signed)
We received a new referral for a patient of Dr Catalina Gravel last seen this year. They are being referred over for atypical face/neck pain from Dr. Ethelene Hal. The patient is wanting to request a provider switch to Dr Lucia Gaskins. Can you please advise?

## 2021-06-01 NOTE — Telephone Encounter (Signed)
It is oK to swtich

## 2021-06-05 ENCOUNTER — Other Ambulatory Visit: Payer: Self-pay

## 2021-06-05 ENCOUNTER — Encounter (HOSPITAL_BASED_OUTPATIENT_CLINIC_OR_DEPARTMENT_OTHER): Payer: Self-pay | Admitting: General Surgery

## 2021-06-05 NOTE — Progress Notes (Signed)
Spoke w/ via phone for pre-op interview---pt  Lab needs dos---- Istat, EKG           Lab results------n/a COVID test -----patient states asymptomatic no test needed Arrive at -------0845 06/08/2021 NPO after MN NO Solid Food.  Clear liquids from MN until---0745 06/08/2021 Med rec completed Medications to take morning of surgery -----n/a Diabetic medication -----n/a Patient instructed no nail polish to be worn day of surgery Patient instructed to bring photo id and insurance card day of surgery Patient aware to have Driver (ride ) / mother Ross Bender caregiver    for 24 hours after surgery  Patient Special Instructions -----Needs to get in touch with PCP to get BP meds renewed  Pre-Op special Istructions ----- Patient verbalized understanding of instructions that were given at this phone interview. Patient denies shortness of breath, chest pain, fever, cough at this phone interview.   Pt told to get in touch with PCP to get BP meds renewed otherwise if his BP is high the day of surgery his surgery could be cancelled, pt verbalized understanding.   Pt reports he has no problems swallowing or eating or moving neck.

## 2021-06-07 NOTE — Anesthesia Preprocedure Evaluation (Addendum)
Anesthesia Evaluation  Patient identified by MRN, date of birth, ID band Patient awake    Reviewed: Allergy & Precautions, NPO status , Patient's Chart, lab work & pertinent test results  Airway Mallampati: II  TM Distance: >3 FB Neck ROM: Full    Dental no notable dental hx. (+) Dental Advisory Given   Pulmonary neg pulmonary ROS, Current Smoker,    Pulmonary exam normal breath sounds clear to auscultation       Cardiovascular hypertension, Normal cardiovascular exam Rhythm:Regular Rate:Normal     Neuro/Psych negative neurological ROS     GI/Hepatic Neg liver ROS, PUD,   Endo/Other  negative endocrine ROS  Renal/GU negative Renal ROS     Musculoskeletal negative musculoskeletal ROS (+)   Abdominal (+) + obese,   Peds  Hematology negative hematology ROS (+)   Anesthesia Other Findings   Reproductive/Obstetrics                            Anesthesia Physical Anesthesia Plan  ASA: 2  Anesthesia Plan: MAC   Post-op Pain Management:    Induction: Intravenous  PONV Risk Score and Plan: 1 and Propofol infusion, TIVA, Midazolam, Treatment may vary due to age or medical condition, Ondansetron and Dexamethasone  Airway Management Planned: Natural Airway  Additional Equipment: None  Intra-op Plan:   Post-operative Plan:   Informed Consent: I have reviewed the patients History and Physical, chart, labs and discussed the procedure including the risks, benefits and alternatives for the proposed anesthesia with the patient or authorized representative who has indicated his/her understanding and acceptance.     Dental advisory given  Plan Discussed with: CRNA  Anesthesia Plan Comments:        Anesthesia Quick Evaluation

## 2021-06-08 ENCOUNTER — Ambulatory Visit (HOSPITAL_BASED_OUTPATIENT_CLINIC_OR_DEPARTMENT_OTHER): Payer: 59 | Admitting: Anesthesiology

## 2021-06-08 ENCOUNTER — Ambulatory Visit (HOSPITAL_BASED_OUTPATIENT_CLINIC_OR_DEPARTMENT_OTHER)
Admission: RE | Admit: 2021-06-08 | Discharge: 2021-06-08 | Disposition: A | Discharge status: Home / Self Care | Payer: 59 | Source: Ambulatory Visit | Attending: General Surgery | Admitting: General Surgery

## 2021-06-08 ENCOUNTER — Encounter (HOSPITAL_BASED_OUTPATIENT_CLINIC_OR_DEPARTMENT_OTHER)
Admission: RE | Disposition: A | Discharge status: Home / Self Care | Payer: Self-pay | Source: Ambulatory Visit | Attending: General Surgery

## 2021-06-08 ENCOUNTER — Encounter (HOSPITAL_BASED_OUTPATIENT_CLINIC_OR_DEPARTMENT_OTHER): Payer: Self-pay | Admitting: General Surgery

## 2021-06-08 ENCOUNTER — Other Ambulatory Visit: Payer: Self-pay

## 2021-06-08 DIAGNOSIS — Z791 Long term (current) use of non-steroidal anti-inflammatories (NSAID): Secondary | ICD-10-CM | POA: Diagnosis not present

## 2021-06-08 DIAGNOSIS — Z792 Long term (current) use of antibiotics: Secondary | ICD-10-CM | POA: Insufficient documentation

## 2021-06-08 DIAGNOSIS — A630 Anogenital (venereal) warts: Secondary | ICD-10-CM | POA: Diagnosis not present

## 2021-06-08 DIAGNOSIS — Z7952 Long term (current) use of systemic steroids: Secondary | ICD-10-CM | POA: Insufficient documentation

## 2021-06-08 DIAGNOSIS — Z79899 Other long term (current) drug therapy: Secondary | ICD-10-CM | POA: Insufficient documentation

## 2021-06-08 HISTORY — PX: LASER ABLATION CONDOLAMATA: SHX5941

## 2021-06-08 HISTORY — PX: RECTAL EXAM UNDER ANESTHESIA: SHX6399

## 2021-06-08 LAB — POCT I-STAT, CHEM 8
BUN: 12 mg/dL (ref 6–20)
Calcium, Ion: 1.24 mmol/L (ref 1.15–1.40)
Chloride: 103 mmol/L (ref 98–111)
Creatinine, Ser: 1 mg/dL (ref 0.61–1.24)
Glucose, Bld: 97 mg/dL (ref 70–99)
HCT: 53 % — ABNORMAL HIGH (ref 39.0–52.0)
Hemoglobin: 18 g/dL — ABNORMAL HIGH (ref 13.0–17.0)
Potassium: 4 mmol/L (ref 3.5–5.1)
Sodium: 141 mmol/L (ref 135–145)
TCO2: 26 mmol/L (ref 22–32)

## 2021-06-08 SURGERY — EXAM UNDER ANESTHESIA, RECTUM
Anesthesia: Monitor Anesthesia Care

## 2021-06-08 MED ORDER — FENTANYL CITRATE (PF) 100 MCG/2ML IJ SOLN
INTRAMUSCULAR | Status: AC
  Filled 2021-06-08: qty 2

## 2021-06-08 MED ORDER — SILVER SULFADIAZINE 1 % EX CREA
TOPICAL_CREAM | CUTANEOUS | Status: DC | PRN
  Administered 2021-06-08: 1 via TOPICAL

## 2021-06-08 MED ORDER — METOPROLOL TARTRATE 5 MG/5ML IV SOLN
INTRAVENOUS | Status: AC
  Filled 2021-06-08: qty 5

## 2021-06-08 MED ORDER — HYDROMORPHONE HCL 1 MG/ML IJ SOLN: 0.2500 mg | INTRAMUSCULAR | Status: DC | PRN

## 2021-06-08 MED ORDER — PROMETHAZINE HCL 25 MG/ML IJ SOLN: 6.2500 mg | INTRAMUSCULAR | Status: DC | PRN

## 2021-06-08 MED ORDER — PROPOFOL 500 MG/50ML IV EMUL
INTRAVENOUS | Status: DC | PRN
  Administered 2021-06-08: 150 ug/kg/min via INTRAVENOUS

## 2021-06-08 MED ORDER — MEPERIDINE HCL 25 MG/ML IJ SOLN: 6.2500 mg | INTRAMUSCULAR | Status: DC | PRN

## 2021-06-08 MED ORDER — STERILE WATER FOR IRRIGATION IR SOLN
Status: DC | PRN
  Administered 2021-06-08: 1000 mL

## 2021-06-08 MED ORDER — FENTANYL CITRATE (PF) 100 MCG/2ML IJ SOLN
INTRAMUSCULAR | Status: DC | PRN
  Administered 2021-06-08 (×2): 50 ug via INTRAVENOUS

## 2021-06-08 MED ORDER — SODIUM CHLORIDE 0.9% FLUSH: 3.0000 mL | Freq: Two times a day (BID) | INTRAVENOUS | Status: DC

## 2021-06-08 MED ORDER — SUGAMMADEX SODIUM 200 MG/2ML IV SOLN
INTRAVENOUS | Status: DC | PRN
  Administered 2021-06-08: 200 mg via INTRAVENOUS

## 2021-06-08 MED ORDER — ROCURONIUM BROMIDE 100 MG/10ML IV SOLN
INTRAVENOUS | Status: DC | PRN
  Administered 2021-06-08: 50 mg via INTRAVENOUS

## 2021-06-08 MED ORDER — BUPIVACAINE LIPOSOME 1.3 % IJ SUSP
INTRAMUSCULAR | Status: DC | PRN
  Administered 2021-06-08: 20 mL

## 2021-06-08 MED ORDER — ACETAMINOPHEN 500 MG PO TABS
1000.0000 mg | ORAL_TABLET | Freq: Once | ORAL | Status: AC
  Administered 2021-06-08: 1000 mg via ORAL

## 2021-06-08 MED ORDER — KETOROLAC TROMETHAMINE 30 MG/ML IJ SOLN
INTRAMUSCULAR | Status: DC | PRN
  Administered 2021-06-08: 30 mg via INTRAVENOUS

## 2021-06-08 MED ORDER — BUPIVACAINE-EPINEPHRINE 0.5% -1:200000 IJ SOLN
INTRAMUSCULAR | Status: DC | PRN
  Administered 2021-06-08: 30 mL

## 2021-06-08 MED ORDER — LACTATED RINGERS IV SOLN: INTRAVENOUS | Status: DC

## 2021-06-08 MED ORDER — OXYCODONE HCL 5 MG PO TABS: 5.0000 mg | ORAL_TABLET | Freq: Once | ORAL | Status: DC | PRN

## 2021-06-08 MED ORDER — ACETIC ACID 5 % SOLN
Status: DC | PRN
  Administered 2021-06-08: 1 via TOPICAL

## 2021-06-08 MED ORDER — ONDANSETRON HCL 4 MG/2ML IJ SOLN
INTRAMUSCULAR | Status: DC | PRN
  Administered 2021-06-08: 4 mg via INTRAVENOUS

## 2021-06-08 MED ORDER — OXYCODONE HCL 5 MG PO TABS: 5.0000 mg | ORAL_TABLET | ORAL | Status: DC | PRN

## 2021-06-08 MED ORDER — METOPROLOL TARTRATE 5 MG/5ML IV SOLN
INTRAVENOUS | Status: DC | PRN
  Administered 2021-06-08 (×2): 2 mg via INTRAVENOUS

## 2021-06-08 MED ORDER — OXYCODONE HCL 5 MG PO TABS: 5.0000 mg | ORAL_TABLET | Freq: Four times a day (QID) | ORAL | 0 refills | Status: DC | PRN

## 2021-06-08 MED ORDER — DEXAMETHASONE SODIUM PHOSPHATE 10 MG/ML IJ SOLN
INTRAMUSCULAR | Status: DC | PRN
  Administered 2021-06-08: 10 mg via INTRAVENOUS

## 2021-06-08 MED ORDER — MIDAZOLAM HCL 2 MG/2ML IJ SOLN
INTRAMUSCULAR | Status: AC
  Filled 2021-06-08: qty 4

## 2021-06-08 MED ORDER — OXYCODONE HCL 5 MG/5ML PO SOLN: 5.0000 mg | Freq: Once | ORAL | Status: DC | PRN

## 2021-06-08 MED ORDER — ACETAMINOPHEN 500 MG PO TABS
ORAL_TABLET | ORAL | Status: AC
  Filled 2021-06-08: qty 2

## 2021-06-08 MED ORDER — ACETAMINOPHEN 325 MG RE SUPP: 650.0000 mg | RECTAL | Status: DC | PRN

## 2021-06-08 MED ORDER — LIDOCAINE HCL (PF) 2 % IJ SOLN
INTRAMUSCULAR | Status: AC
  Filled 2021-06-08: qty 5

## 2021-06-08 MED ORDER — PROPOFOL 500 MG/50ML IV EMUL
INTRAVENOUS | Status: AC
  Filled 2021-06-08: qty 50

## 2021-06-08 MED ORDER — LIDOCAINE 2% (20 MG/ML) 5 ML SYRINGE
INTRAMUSCULAR | Status: DC | PRN
  Administered 2021-06-08: 100 mg via INTRAVENOUS

## 2021-06-08 MED ORDER — SODIUM CHLORIDE 0.9 % IV SOLN: 250.0000 mL | INTRAVENOUS | Status: DC | PRN

## 2021-06-08 MED ORDER — ONDANSETRON HCL 4 MG/2ML IJ SOLN
INTRAMUSCULAR | Status: AC
  Filled 2021-06-08: qty 2

## 2021-06-08 MED ORDER — DEXAMETHASONE SODIUM PHOSPHATE 10 MG/ML IJ SOLN
INTRAMUSCULAR | Status: AC
  Filled 2021-06-08: qty 1

## 2021-06-08 MED ORDER — MIDAZOLAM HCL 5 MG/5ML IJ SOLN
INTRAMUSCULAR | Status: DC | PRN
  Administered 2021-06-08 (×2): 2 mg via INTRAVENOUS

## 2021-06-08 MED ORDER — SODIUM CHLORIDE 0.9% FLUSH: 3.0000 mL | INTRAVENOUS | Status: DC | PRN

## 2021-06-08 MED ORDER — ROCURONIUM BROMIDE 10 MG/ML (PF) SYRINGE
PREFILLED_SYRINGE | INTRAVENOUS | Status: AC
  Filled 2021-06-08: qty 10

## 2021-06-08 MED ORDER — PROPOFOL 10 MG/ML IV BOLUS
INTRAVENOUS | Status: DC | PRN
  Administered 2021-06-08: 110 mg via INTRAVENOUS
  Administered 2021-06-08 (×2): 30 mg via INTRAVENOUS

## 2021-06-08 MED ORDER — ACETAMINOPHEN 325 MG PO TABS: 650.0000 mg | ORAL_TABLET | ORAL | Status: DC | PRN

## 2021-06-08 SURGICAL SUPPLY — 51 items
BENZOIN TINCTURE PRP APPL 2/3 (GAUZE/BANDAGES/DRESSINGS) ×4 IMPLANT
BLADE EXTENDED COATED 6.5IN (ELECTRODE) IMPLANT
BLADE SURG 10 STRL SS (BLADE) IMPLANT
COVER BACK TABLE 60X90IN (DRAPES) ×2 IMPLANT
COVER MAYO STAND STRL (DRAPES) ×2 IMPLANT
DECANTER SPIKE VIAL GLASS SM (MISCELLANEOUS) IMPLANT
DRAPE LAPAROTOMY 100X72 PEDS (DRAPES) ×4 IMPLANT
DRAPE UTILITY XL STRL (DRAPES) ×4 IMPLANT
DRSG PAD ABDOMINAL 8X10 ST (GAUZE/BANDAGES/DRESSINGS) ×2 IMPLANT
ELECT REM PT RETURN 9FT ADLT (ELECTROSURGICAL) ×4
ELECTRODE REM PT RTRN 9FT ADLT (ELECTROSURGICAL) ×2 IMPLANT
GAUZE 4X4 16PLY ~~LOC~~+RFID DBL (SPONGE) ×2 IMPLANT
GAUZE SPONGE 4X4 12PLY STRL (GAUZE/BANDAGES/DRESSINGS) ×2 IMPLANT
GAUZE SPONGE 4X4 12PLY STRL LF (GAUZE/BANDAGES/DRESSINGS) ×2 IMPLANT
GLOVE SURG ENC MOIS LTX SZ6.5 (GLOVE) ×4 IMPLANT
GLOVE SURG UNDER LTX SZ6.5 (GLOVE) ×4 IMPLANT
GOWN STRL REUS W/TWL XL LVL3 (GOWN DISPOSABLE) ×4 IMPLANT
HYDROGEN PEROXIDE 16OZ (MISCELLANEOUS) IMPLANT
IV CATH 14GX2 1/4 (CATHETERS) IMPLANT
IV CATH 18G SAFETY (IV SOLUTION) IMPLANT
KIT SIGMOIDOSCOPE (SET/KITS/TRAYS/PACK) IMPLANT
KIT TURNOVER CYSTO (KITS) ×2 IMPLANT
LOOP VESSEL MAXI BLUE (MISCELLANEOUS) IMPLANT
NDL SAFETY ECLIPSE 18X1.5 (NEEDLE) IMPLANT
NEEDLE HYPO 18GX1.5 SHARP (NEEDLE)
NEEDLE HYPO 22GX1.5 SAFETY (NEEDLE) ×2 IMPLANT
NS IRRIG 500ML POUR BTL (IV SOLUTION) ×4 IMPLANT
PACK BASIN DAY SURGERY FS (CUSTOM PROCEDURE TRAY) ×2 IMPLANT
PAD ARMBOARD 7.5X6 YLW CONV (MISCELLANEOUS) ×4 IMPLANT
PANTS MESH DISP LRG (UNDERPADS AND DIAPERS) ×1 IMPLANT
PANTS MESH DISPOSABLE L (UNDERPADS AND DIAPERS) ×1
PENCIL SMOKE EVACUATOR (MISCELLANEOUS) ×2 IMPLANT
SPONGE HEMORRHOID 8X3CM (HEMOSTASIS) ×2 IMPLANT
SPONGE SURGIFOAM ABS GEL 12-7 (HEMOSTASIS) IMPLANT
SUCTION FRAZIER HANDLE 10FR (MISCELLANEOUS)
SUCTION TUBE FRAZIER 10FR DISP (MISCELLANEOUS) IMPLANT
SURGILUBE 2OZ TUBE FLIPTOP (MISCELLANEOUS) ×4 IMPLANT
SUT CHROMIC 2 0 SH (SUTURE) IMPLANT
SUT CHROMIC 3 0 SH 27 (SUTURE) IMPLANT
SUT ETHIBOND 0 (SUTURE) IMPLANT
SUT VIC AB 2-0 SH 27 (SUTURE)
SUT VIC AB 2-0 SH 27XBRD (SUTURE) IMPLANT
SUT VIC AB 3-0 SH 18 (SUTURE) IMPLANT
SUT VIC AB 3-0 SH 27 (SUTURE)
SUT VIC AB 3-0 SH 27XBRD (SUTURE) IMPLANT
SYR CONTROL 10ML LL (SYRINGE) ×2 IMPLANT
TOWEL OR 17X26 10 PK STRL BLUE (TOWEL DISPOSABLE) ×4 IMPLANT
TRAY DSU PREP LF (CUSTOM PROCEDURE TRAY) ×4 IMPLANT
TUBE CONNECTING 12X1/4 (SUCTIONS) ×2 IMPLANT
VACUUM HOSE 7/8X10 W/ WAND (MISCELLANEOUS) ×2 IMPLANT
YANKAUER SUCT BULB TIP NO VENT (SUCTIONS) ×2 IMPLANT

## 2021-06-08 NOTE — Discharge Instructions (Addendum)
ANORECTAL SURGERY: POST OP INSTRUCTIONS Take your usually prescribed home medications unless otherwise directed. DIET: During the first few hours after surgery sip on some liquids until you are able to urinate.  It is normal to not urinate for several hours after this surgery.  If you feel uncomfortable, please contact the office for instructions.  After you are able to urinate,you may eat, if you feel like it.  Follow a light bland diet the first 24 hours after arrival home, such as soup, liquids, crackers, etc.  Be sure to include lots of fluids daily (6-8 glasses).  Avoid fast food or heavy meals, as your are more likely to get nauseated.  Eat a low fat diet the next few days after surgery.  Limit caffeine intake to 1-2 servings a day. PAIN CONTROL: Pain is best controlled by a usual combination of several different methods TOGETHER: Muscle relaxation: Soak in a warm bath (or Sitz bath) three times a day and after bowel movements.  Continue to do this until all pain is resolved. Over the counter pain medication Prescription pain medication Most patients will experience some swelling and discomfort in the anus/rectal area and incisions.  Heat such as warm towels, sitz baths, warm baths, etc to help relax tight/sore spots and speed recovery.  Some people prefer to use ice, especially in the first couple days after surgery, as it may decrease the pain and swelling, or alternate between ice & heat.  Experiment to what works for you.  Swelling and bruising can take several weeks to resolve.  Pain can take even longer to completely resolve. It is helpful to take an over-the-counter pain medication regularly for the first few weeks.  Choose one of the following that works best for you: Naproxen (Aleve, etc)  Two 220mg tabs twice a day Ibuprofen (Advil, etc) Three 200mg tabs four times a day (every meal & bedtime) A  prescription for pain medication (such as percocet, oxycodone, hydrocodone, etc) should be  given to you upon discharge.  Take your pain medication as prescribed.  If you are having problems/concerns with the prescription medicine (does not control pain, nausea, vomiting, rash, itching, etc), please call us [phone] to see if we need to switch you to a different pain medicine that will work better for you and/or control your side effect better. If you need a refill on your pain medication, please contact your pharmacy.  They will contact our office to request authorization. Prescriptions will not be filled after 5 pm or on week-ends. KEEP YOUR BOWELS REGULAR and AVOID CONSTIPATION The goal is one to two soft bowel movements a day.  You should at least have a bowel movement every other day. Avoid getting constipated.  Between the surgery and the pain medications, it is common to experience some constipation. This can be very painful after rectal surgery.  Increasing fluid intake and taking a fiber supplement (such as Metamucil, Citrucel, FiberCon, etc) 1-2 times a day regularly will usually help prevent this problem from occurring.  A stool softener like colace is also recommended.  This can be purchased over the counter at your pharmacy.  You can take it up to 3 times a day.  If you do not have a bowel movement after 24 hrs since your surgery, take one does of milk of magnesia.  If you still haven't had a bowel movement 8-12 hours after that dose, take another dose.  If you don't have a bowel movement 48 hrs after surgery,   purchase a Fleets enema from the drug store and administer gently per package instructions.  If you still are having trouble with your bowel movements after that, please call the office for further instructions. If you develop diarrhea or have many loose bowel movements, simplify your diet to bland foods & liquids for a few days.  Stop any stool softeners and decrease your fiber supplement.  Switching to mild anti-diarrheal medications (Kayopectate, Pepto Bismol) can help.   If this worsens or does not improve, please call us.  Wound Care Remove your bandages before your first bowel movement or 8 hours after surgery.     Remove any wound packing material at this tim,e as well.  You do not need to repack the wound unless instructed otherwise.  Wear an absorbent pad or soft cotton gauze in your underwear to catch any drainage and help keep the area clean. You should change this every 2-3 hours while awake. Keep the area clean and dry.  Bathe / shower every day, especially after bowel movements.  Keep the area clean by showering / bathing over the incision / wound.   It is okay to soak an open wound to help wash it.  Wet wipes or showers / gentle washing after bowel movements is often less traumatic than regular toilet paper. You may have some styrofoam-like soft packing in the rectum which will come out with the first bowel movement.  You will often notice bleeding with bowel movements.  This should slow down by the end of the first week of surgery Expect some drainage.  This should slow down, too, by the end of the first week of surgery.  Place the Silvadene cream on your skin after washing the area gently.  Wash off with warm soap and water before applying more.  Wear an absorbent pad or soft cotton gauze in your underwear until the drainage stops. Do Not sit on a rubber or pillow ring.  This can make you symptoms worse.  You may sit on a soft pillow if needed.  ACTIVITIES as tolerated:   You may resume regular (light) daily activities beginning the next day--such as daily self-care, walking, climbing stairs--gradually increasing activities as tolerated.  If you can walk 30 minutes without difficulty, it is safe to try more intense activity such as jogging, treadmill, bicycling, low-impact aerobics, swimming, etc. Save the most intensive and strenuous activity for last such as sit-ups, heavy lifting, contact sports, etc  Refrain from any heavy lifting or straining until you  are off narcotics for pain control.   You may drive when you are no longer taking prescription pain medication, you can comfortably sit for long periods of time, and you can safely maneuver your car and apply brakes. You may have sexual intercourse when it is comfortable.  FOLLOW UP in our office Please call [facility] at [phone] to set up an appointment to see your surgeon in the office for a follow-up appointment approximately 3-4 weeks after your surgery. Make sure that you call for this appointment the day you arrive home to insure a convenient appointment time. 10. IF YOU HAVE DISABILITY OR FAMILY LEAVE FORMS, BRING THEM TO THE OFFICE FOR PROCESSING.  DO NOT GIVE THEM TO YOUR DOCTOR.     WHEN TO CALL us [phone]: Poor pain control Reactions / problems with new medications (rash/itching, nausea, etc)  Fever over 101.5 F (38.5 C) Inability to urinate Nausea and/or vomiting Worsening swelling or bruising Continued bleeding from incision.  Increased pain, redness, or drainage from the incision  The clinic staff is available to answer your questions during regular business hours (8:30am-5pm).  Please don't hesitate to call and ask to speak to one of our nurses for clinical concerns.   A surgeon from [facility] is always on call at the hospitals   If you have a medical emergency, go to the nearest emergency room or call 911.    [facility] [address], [address], [place], [place]  [address] ? MAIN: [phone] ? TOLL FREE: [phone] ? FAX [phone] [email or web address]    Post Anesthesia Home Care Instructions  Activity: Get plenty of rest for the remainder of the day. A responsible individual must stay with you for 24 hours following the procedure.  For the next 24 hours, DO NOT: -Drive a car -Advertising copywriter -Drink alcoholic beverages -Take any medication unless instructed by your physician -Make any legal  decisions or sign important papers.  Meals: Start with liquid foods such as gelatin or soup. Progress to regular foods as tolerated. Avoid greasy, spicy, heavy foods. If nausea and/or vomiting occur, drink only clear liquids until the nausea and/or vomiting subsides. Call your physician if vomiting continues.  Special Instructions/Symptoms: Your throat may feel dry or sore from the anesthesia or the breathing tube placed in your throat during surgery. If this causes discomfort, gargle with warm salt water. The discomfort should disappear within 24 hours.  **No Tylenol or acetaminophen until 3:00pm today if needed.  **No ibuprofen, Advil, Aleve, Motrin, ketorolac, meloxicam, or naproxen until after 6pm today if needed.   Information for Discharge Teaching: EXPAREL (bupivacaine liposome injectable suspension)   Your surgeon or anesthesiologist gave you EXPAREL(bupivacaine) to help control your pain after surgery.  EXPAREL is a local anesthetic that provides pain relief by numbing the tissue around the surgical site. EXPAREL is designed to release pain medication over time and can control pain for up to 72 hours. Depending on how you respond to EXPAREL, you may require less pain medication during your recovery.  Possible side effects: Temporary loss of sensation or ability to move in the area where bupivacaine was injected. Nausea, vomiting, constipation Rarely, numbness and tingling in your mouth or lips, lightheadedness, or anxiety may occur. Call your doctor right away if you think you may be experiencing any of these sensations, or if you have other questions regarding possible side effects.  Follow all other discharge instructions given to you by your surgeon or nurse. Eat a healthy diet and drink plenty of water or other fluids.  If you return to the hospital for any reason within 96 hours following the administration of EXPAREL, it is important for health care providers to know that  you have received this anesthetic. A teal colored band has been placed on your arm with the date, time and amount of EXPAREL you have received in order to alert and inform your health care providers. Please leave this armband in place for the full 96 hours following administration, and then you may remove the band.

## 2021-06-08 NOTE — Transfer of Care (Addendum)
Immediate Anesthesia Transfer of Care Note  Patient: Ricardo [name]  Procedure(s) Performed: RECTAL EXAM UNDER ANESTHESIA LASER ABLATION CONDOLAMATA  Patient Location: PACU  Anesthesia Type:General  Level of Consciousness: drowsy  Airway & Oxygen Therapy: Patient Spontanous Breathing and Patient connected to face mask oxygen  Post-op Assessment: Report given to RN  Post vital signs: Reviewed and stable, BP at baseline given Metoprolol 2mg ,   Last Vitals:  Vitals Value Taken Time  BP 149/110 06/08/21 1233  Temp    Pulse 74 06/08/21 1235  Resp 21 06/08/21 1235  SpO2 95 % 06/08/21 1235  Vitals shown include unvalidated device data.  Last Pain:  Vitals:   06/08/21 1230  TempSrc:   PainSc: 0-No pain      Patients Stated Pain Goal: 5 (06/08/21 0919)  Complications: No notable events documented.

## 2021-06-08 NOTE — H&P (Signed)
The patient is a 40 year old male who presents with a complaint of anal problems. 40 year old male who presents to the office for evaluation of a perianal lesion.  He states this is been present for several months.  He saw his dermatologist about this and was diagnosed with anal warts.  Cryoablation has been attempted on 2 occasions with continued recurrence.  He is here today for evaluation and further treatment     Past Medical History:  Diagnosis Date   Facial pain     Hypertension     Neck pain     Swollen gland      swells intermittently - neck         Past Surgical History:  Procedure Laterality Date   no past surgery             Family History  Problem Relation Age of Onset   Irritable bowel syndrome Sister     Diabetes Sister     Hyperlipidemia Sister     Hypertension Sister     Hypertension Mother     Hypertension Father     Hyperlipidemia Father      Social History         Socioeconomic History   Marital status: Single      Spouse name: Not on file   Number of children: 3   Years of education: 12   Highest education level: High school graduate  Occupational History   Occupation: owns Regulatory affairs officer  Tobacco Use   Smoking status: Some Days      Pack years: 0.00      Types: Cigars   Smokeless tobacco: Never  Substance and Sexual Activity   Alcohol use: Yes      Alcohol/week: 0.0 standard drinks      Comment: occasional use   Drug use: No   Sexual activity: Not on file  Other Topics Concern   Not on file  Social History Narrative    Lives with parents.    Right-handed.    No daily use of caffeine.    Social Determinants of Health    Financial Resource Strain: Not on file  Food Insecurity: Not on file  Transportation Needs: Not on file  Physical Activity: Not on file  Stress: Not on file  Social Connections: Not on file  Intimate Partner Violence: Not on file        Allergies  Latex Exam Gloves *MEDICAL DEVICES AND SUPPLIES*  Allergies  Reconciled    Medication History ( amLODIPine Besylate  (5MG  Tablet, Oral) Active. Cephalexin  (250MG  Capsule, Oral) Active. Cephalexin  (500MG  Capsule, Oral) Active. Cyclobenzaprine HCl  (10MG  Tablet, Oral) Active. Diclofenac Sodium  (1% Gel, External) Active. Doxycycline Hyclate  (100MG  Capsule, Oral) Active. Gabapentin  (300MG  Capsule, Oral) Active. Imiquimod  (5% Cream, External) Active. Losartan Potassium-HCTZ  (50-12.5MG  Tablet, Oral) Active. Meloxicam  (15MG  Tablet, Oral) Active. Methocarbamol  (500MG  Tablet, Oral) Active. predniSONE  (10MG  Tablet, Oral) Active. tiZANidine HCl  (4MG  Tablet, Oral) Active. Medications Reconciled    Review of Systems - General ROS: negative Respiratory ROS: no cough, shortness of breath, or wheezing Cardiovascular ROS: no chest pain or dyspnea on exertion Gastrointestinal ROS: no abdominal pain, change in bowel habits, or black or bloody stools Genito-Urinary ROS: no dysuria, trouble voiding, or hematuria      BP (!) 153/101   Pulse (!) 102   Temp 98.9 F (37.2 C) (Oral)   Resp 16   Ht 5\' 6"  (1.676  m)   Wt 95.9 kg   SpO2 97%   BMI 34.14 kg/m     Physical Exam    General Mental Status - Alert. General Appearance - Cooperative. CV: RRR Lungs: CTA Abd: soft Rectal Anorectal Exam External - Note: Multiple condylomatous lesions noted. A posterior midline externally and progressing into the internal anal canal.       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 He has tried several treatments of cryotherapy at his dermatologist office. These have been unsuccessful. He presents today for further evaluation. He appears to have some lesions noted at posterior midline. I have recommended laser ablation in the operating room. We have discussed this in detail including postoperative pain, and recurrence rates. We discussed the management of anal  warts. We discussed chemical destruction, immunotherapy, and surgical excision. I discussed the pros and cons of each approach. We discussed the risk and benefits and the expected outcome with chemical destruction with agents such as podophyllin. I explained that podophyllin is generally not been effective and has a high recurrence rate. We discussed the use of Aldara ointment. I explained that it has a 30-70% chance at resolving or at least reducing the number of anal warts. I explained that it is applied 3 times a week at night and left on overnight. I explained that skin irritation is the most common side effect. We then discussed surgical excision specifically excision and fulguration. I explained how the surgery is performed. I explained that it can be painful however it generally has the highest success rate. We discussed the risk and benefits of surgery including but not limited to bleeding, infection, injury to surrounding structures, need to do a formal anoscopic exam to evaluate for anal canal warts, urinary retention, wart recurrence, and general anesthesia risk. We discussed the typical aftercare.

## 2021-06-08 NOTE — Op Note (Signed)
06/08/2021  12:04 PM  PATIENT:  Ricardo [name]  40 y.o. male  Patient Care Team: Seward Carol, MD as PCP - General (Internal Medicine) Pedro Earls, MD as Attending Physician (Family Medicine)  PRE-OPERATIVE DIAGNOSIS:  ANAL CONDYLOMA  POST-OPERATIVE DIAGNOSIS:  ANAL CONDYLOMA  PROCEDURE:  Procedure(s): RECTAL EXAM UNDER ANESTHESIA LASER ABLATION CONDOLAMATA   Surgeon(s): Leighton Ruff, MD  ASSISTANT: none   ANESTHESIA:   local and general  EBL: 10 ml  No intake/output data recorded.  SPECIMEN:  No Specimen  DISPOSITION OF SPECIMEN:  N/A  COUNTS:  YES  PLAN OF CARE: Discharge to home after PACU  PATIENT DISPOSITION:  PACU - hemodynamically stable.  INDICATION: 40 y.o. M with internal and external anal condyloma  OR FINDINGS: perianal condyloma mostly concentrated to posterior, internal condyloma throughout with largest lesions at posterior and anterior midline.  DESCRIPTION: The patient was identified in the preoperative holding area and taken to the OR where they were laid prone on the operating room table in jack knfe position. MAC anesthesia was smoothly induced.  The patient was then prepped and draped in the usual sterile fashion. A surgical timeout was performed indicating the correct patient, procedure, positioning and preoperative antibioitics. SCDs were noted to be in place and functioning prior to the operation.  A rectal block was placed using half percent Marcaine mixed with epinephrine.  After this was completed, a sponge was soaked in 5% acetic acid was placed over the perianal region. Next the laser was brought onto the field.  The edges of the operative field were draped with wet towels.  Appropriate ventilation was obtained.  All staff were protected with small particle masks and goggles safe for the laser.  The laser was then activated.  We began to ablate the internal condylomatous lesions.  At this point the patient was not tolerating monitored  anesthesia and it was felt to be unsafe to proceed.  The procedure was stopped and the patient was undraped and placed back onto a gurney.  He was intubated via the anesthesiology team and rolled back into position prone.  He was then reprepped and draped and we resumed our procedure.  All condylomatous lesions were then ablated internally.  There was a large concentration at posterior midline.  Larger lesions were noted at left lateral posterior midline and anterior midline.  I then worked my way to the perianal region.  I soak the area in acetic acid and ablated any acetowhite areas.  Most of this was concentrated around the posterior midline.  Once this was completed a Gelfoam sponge was placed into the anal canal.  A thin layer of Silvadene was then placed over the lesions. A sterile dressing was applied over this. The patient was then awakened from anesthesia and sent to the postanesthesia care unit in stable condition. All counts were correct operating room staff.

## 2021-06-08 NOTE — Anesthesia Procedure Notes (Signed)
Procedure Name: Intubation Date/Time: 06/08/2021 11:21 AM Performed by: Bonney Aid, CRNA Pre-anesthesia Checklist: Patient identified, Emergency Drugs available, Suction available and Patient being monitored Patient Re-evaluated:Patient Re-evaluated prior to induction Oxygen Delivery Method: Circle system utilized Preoxygenation: Pre-oxygenation with 100% oxygen Induction Type: IV induction Ventilation: Mask ventilation without difficulty Laryngoscope Size: Mac and 4 Grade View: Grade II Tube type: Oral Tube size: 7.5 mm Number of attempts: 1 Airway Equipment and Method: Stylet Placement Confirmation: ETT inserted through vocal cords under direct vision, positive ETCO2 and breath sounds checked- equal and bilateral Secured at: 22 cm Tube secured with: Tape Dental Injury: Teeth and Oropharynx as per pre-operative assessment

## 2021-06-09 ENCOUNTER — Encounter (HOSPITAL_BASED_OUTPATIENT_CLINIC_OR_DEPARTMENT_OTHER): Payer: Self-pay | Admitting: General Surgery

## 2021-06-09 NOTE — Anesthesia Postprocedure Evaluation (Signed)
Anesthesia Post Note  Patient: Ricardo [name]  Procedure(s) Performed: RECTAL EXAM UNDER ANESTHESIA LASER ABLATION CONDOLAMATA     Patient location during evaluation: PACU Anesthesia Type: MAC Level of consciousness: awake and alert Pain management: pain level controlled Vital Signs Assessment: post-procedure vital signs reviewed and stable Respiratory status: spontaneous breathing Cardiovascular status: stable Anesthetic complications: no Comments: Pt encouraged to seek primary care physician and pursue OSA workup. Discussed his HTN and already signs of LVH on EKG. He expressed understanding. I spoke with Dr. Marcello Moores as well and requested that she also encourage him to seek medical attention.   No notable events documented.  Last Vitals:  Vitals:   06/08/21 1300 06/08/21 1408  BP: (!) 128/102 (!) 145/109  Pulse: 77 79  Resp: (!) 24 16  Temp:  37.1 C  SpO2: 94% 99%    Last Pain:  Vitals:   06/08/21 1330  TempSrc:   PainSc: 0-No pain                 Nolon Nations

## 2021-06-19 ENCOUNTER — Encounter (HOSPITAL_COMMUNITY): Payer: Self-pay

## 2021-06-19 ENCOUNTER — Other Ambulatory Visit: Payer: Self-pay

## 2021-06-19 ENCOUNTER — Emergency Department (HOSPITAL_COMMUNITY)
Admission: EM | Admit: 2021-06-19 | Discharge: 2021-06-20 | Disposition: A | Discharge status: Home / Self Care | Payer: 59 | Attending: Emergency Medicine | Admitting: Emergency Medicine

## 2021-06-19 ENCOUNTER — Encounter: Payer: Self-pay | Admitting: Emergency Medicine

## 2021-06-19 ENCOUNTER — Ambulatory Visit
Admission: EM | Admit: 2021-06-19 | Discharge: 2021-06-19 | Disposition: A | Discharge status: Home / Self Care | Payer: 59

## 2021-06-19 DIAGNOSIS — E875 Hyperkalemia: Secondary | ICD-10-CM | POA: Insufficient documentation

## 2021-06-19 DIAGNOSIS — K5900 Constipation, unspecified: Secondary | ICD-10-CM | POA: Diagnosis present

## 2021-06-19 DIAGNOSIS — G8918 Other acute postprocedural pain: Secondary | ICD-10-CM | POA: Diagnosis not present

## 2021-06-19 DIAGNOSIS — K6289 Other specified diseases of anus and rectum: Secondary | ICD-10-CM

## 2021-06-19 DIAGNOSIS — Z9889 Other specified postprocedural states: Secondary | ICD-10-CM

## 2021-06-19 DIAGNOSIS — I10 Essential (primary) hypertension: Secondary | ICD-10-CM | POA: Diagnosis not present

## 2021-06-19 DIAGNOSIS — F17290 Nicotine dependence, other tobacco product, uncomplicated: Secondary | ICD-10-CM | POA: Insufficient documentation

## 2021-06-19 DIAGNOSIS — K5903 Drug induced constipation: Secondary | ICD-10-CM

## 2021-06-19 DIAGNOSIS — T402X5A Adverse effect of other opioids, initial encounter: Secondary | ICD-10-CM

## 2021-06-19 MED ORDER — LIDOCAINE HCL URETHRAL/MUCOSAL 2 % EX GEL
1.0000 "application " | Freq: Once | CUTANEOUS | Status: AC
  Administered 2021-06-20: 1 via TOPICAL
  Filled 2021-06-19: qty 11

## 2021-06-19 MED ORDER — HYDROMORPHONE HCL 1 MG/ML IJ SOLN
1.0000 mg | Freq: Once | INTRAMUSCULAR | Status: AC | PRN
  Administered 2021-06-20: 1 mg via INTRAVENOUS
  Filled 2021-06-19: qty 1

## 2021-06-19 MED ORDER — SORBITOL 70 % SOLN
960.0000 mL | TOPICAL_OIL | Freq: Once | ORAL | Status: AC
  Administered 2021-06-20: 960 mL via RECTAL
  Filled 2021-06-19: qty 473

## 2021-06-19 NOTE — ED Triage Notes (Signed)
Patient states that he had rectal surgery on 9/15. Patient states that he has not had a normal BM 1 1/2 weeks. Patient states he did not receive instructions on taking laxatives. Patient states that he has been taking Miralax and MOM 3 days ago. Patient states he had some relief, but states he feels like he has a blockage in his rectum Patient also reports that he tried to use a fleets enema,but was unsuccessful due to pain from surgery.

## 2021-06-19 NOTE — ED Provider Notes (Signed)
Emergency Medicine Provider Triage Evaluation Note  Ricardo [name] , a 40 y.o. male  was evaluated in triage.  Pt complains of rectal pain.  Had procedure on 9/15 on his rectum.  Has been on pain medicine since.  Not on any laxatives or stool softeners.  Has not been able to have a bowel movement since.  He has the urge to go, but feels there is a stool ball in his rectum.  This is causing a lot of pain.  He has had middle improvement since starting laxatives, is having diarrhea around the stool ball.  No fevers.  No nausea vomiting  Review of Systems  Positive: Rectal pain, CN Negative: Fever, n/v  Physical Exam  BP (!) 138/108 (BP Location: Left Arm)   Pulse 85   Temp 98.3 F (36.8 C) (Oral)   Resp 16   Ht 5\' 6"  (1.676 m)   Wt 93 kg   SpO2 97%   BMI 33.09 kg/m  Gen:   Awake, no distress   Resp:  Normal effort  MSK:   Moves extremities without difficulty  Other:  No ttp of abd  Medical Decision Making  Medically screening exam initiated at 4:46 PM.  Appropriate orders placed.  RUMI TARAS was informed that the remainder of the evaluation will be completed by another provider, this initial triage assessment does not replace that evaluation, and the importance of remaining in the ED until their evaluation is complete.     Mackie Pai, PA-C 06/19/21 1647    06/21/21, MD 06/19/21 [phone]

## 2021-06-19 NOTE — ED Triage Notes (Addendum)
9/15 had a surgery on his anus. Has been using the appropriate creams and was improving, but states no one told him to take laxatives. Now he is constipated and it is extremely painful to have a BM. Tried laxatives and an enema without success. Got prescribed a numbing cream but states it's not at the pharmacy and likely wont be there until tomorrow.

## 2021-06-19 NOTE — ED Provider Notes (Signed)
[facility]   MRN: [id number] DOB: Aug 18, 1981  Subjective:   Ricardo [name] is a 40 y.o. male presenting for perianal pain.  Patient is status post rectal EUA/laser ablation condolamata on 06/08/2021.  Has been using oxycodone daily since then.  Just started to use laxatives, tried an enema to have a bowel movement.  Reports that he has been very constipated in the past few days.  He is not nauseated, vomiting.  He is still drinking fluids.  States that he is not able to pass gas.  We did contact the surgeon that did his procedure and states that he was supposed to get lidocaine gel but the pharmacy did not have it.  He did not contact him again because he does not want to talk to them.  No current facility-administered medications for this encounter.  Current Outpatient Medications:    oxyCODONE (OXY IR/ROXICODONE) 5 MG immediate release tablet, Take 1-2 tablets (5-10 mg total) by mouth every 6 (six) hours as needed for severe pain., Disp: 30 tablet, Rfl: 0   Allergies  Allergen Reactions   Latex Rash    Past Medical History:  Diagnosis Date   Facial pain    able to eat and swallow.06/05/2021   Hypertension    pt has run out of high blood pressure and is not taking anything for his hypertension.  needs to schedule an appointment with dr. 06/05/2021   Neck pain    able to move his neck with out any problems. 06/05/2021   Swollen gland    swells intermittently - neck     Past Surgical History:  Procedure Laterality Date   boil     LASER ABLATION CONDOLAMATA N/A 06/08/2021   Procedure: LASER ABLATION CONDOLAMATA;  Surgeon: Romie Levee, MD;  Location: [facility];  Service: General;  Laterality: N/A;   no past surgery     RECTAL EXAM UNDER ANESTHESIA N/A 06/08/2021   Procedure: RECTAL EXAM UNDER ANESTHESIA;  Surgeon: Romie Levee, MD;  Location: [facility];  Service: General;  Laterality: N/A;   SKIN LESION EXCISION     30 yrs ago  had boil removed from his butt. 06/05/2021    Family History  Problem Relation Age of Onset   Irritable bowel syndrome Sister    Diabetes Sister    Hyperlipidemia Sister    Hypertension Sister    Hypertension Mother    Hypertension Father    Hyperlipidemia Father     Social History   Tobacco Use   Smoking status: Some Days    Types: Cigars   Smokeless tobacco: Never  Vaping Use   Vaping Use: Never used  Substance Use Topics   Alcohol use: Yes    Alcohol/week: 0.0 standard drinks    Comment: occasional use   Drug use: No    ROS   Objective:   Vitals: BP (!) 146/95 (BP Location: Left Arm)   Pulse 91   Temp 98.5 F (36.9 C) (Oral)   Resp 16   SpO2 96%   Physical Exam  Refused exam.  Assessment and Plan :   I have reviewed the PDMP during this encounter.  1. Anal pain   2. Status post surgery   3. Constipation due to opioid therapy     Counseled patient that he should avoid the use of opioids as this is very likely the source of his constipation and secondary perianal pain in addition to recent procedure he had.  Recommended that  he follow-up with his general surgeon for further recommendations.  I advised him that I would not be able to do an enema or manual disimpaction here in our clinic.  I do not suspect that he is having difficulty with an obstruction but patient did not want an exam.  States that he will go to the hospital.   Wallis Bamberg, PA-C 06/19/21 1531

## 2021-06-19 NOTE — ED Provider Notes (Signed)
[facility]-EMERGENCY DEPT Provider Note   CSN: [id number] Arrival date & time: 06/19/21  1619     History Chief Complaint  Patient presents with   Post-op Problem    Ricardo [name] is a 40 y.o. male.  40 year old male who presents emerged from today with painful bowel movements.  Patient had a condyloma excision on the 18th.  He has not had a normal bowel movement since that time.  States he has had about 2 weeks of pretty significant pain and that started to get better but in the last day or 2 he has had a sensation of having a stool.  He tried to disimpact himself but could not find any hard stool.  He went to urgent care and was sent here.  Patient states he is having some diarrhea and has not actually refile any bowel in his rectum just the sensation.  Not having nausea or vomiting.  No fevers.  No abdominal pain.   Past Medical History:  Diagnosis Date   Facial pain    able to eat and swallow.06/05/2021   Hypertension    pt has run out of high blood pressure and is not taking anything for his hypertension.  needs to schedule an appointment with dr. 06/05/2021   Neck pain    able to move his neck with out any problems. 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   Irritable bowel syndrome 07/09/2012    Past Surgical History:  Procedure Laterality Date   boil     LASER ABLATION CONDOLAMATA N/A 06/08/2021   Procedure: LASER ABLATION CONDOLAMATA;  Surgeon: Romie Levee, MD;  Location: [facility];  Service: General;  Laterality: N/A;   no past surgery     RECTAL EXAM UNDER ANESTHESIA N/A 06/08/2021   Procedure: RECTAL EXAM UNDER ANESTHESIA;  Surgeon: Romie Levee, MD;  Location: [facility] [place];  Service: General;  Laterality: N/A;   SKIN LESION EXCISION     30 yrs ago had boil  removed from his butt. 06/05/2021       Family History  Problem Relation Age of Onset   Irritable bowel syndrome Sister    Diabetes Sister    Hyperlipidemia Sister    Hypertension Sister    Hypertension Mother    Hypertension Father    Hyperlipidemia Father     Social History   Tobacco Use   Smoking status: Some Days    Types: Cigars   Smokeless tobacco: Never  Vaping Use   Vaping Use: Never used  Substance Use Topics   Alcohol use: Yes    Alcohol/week: 0.0 standard drinks    Comment: occasional use   Drug use: No    Home Medications Prior to Admission medications   Medication Sig Start Date End Date Taking? Authorizing Provider  magnesium citrate SOLN Take 296 mLs (1 Bottle total) by mouth once for 1 dose. 06/20/21 06/20/21 Yes Jerimy Johanson, Barbara Cower, MD  oxyCODONE (OXY IR/ROXICODONE) 5 MG immediate release tablet Take 1-2 tablets (5-10 mg total) by mouth every 6 (six) hours as needed for severe pain. 06/08/21  Yes Romie Levee, MD  polyethylene glycol (MIRALAX / GLYCOLAX) 17 g packet Take 17 g by mouth daily. 06/20/21  Yes Starkeisha Vanwinkle, Barbara Cower, MD    Allergies    Latex  Review  of Systems   Review of Systems  All other systems reviewed and are negative.  Physical Exam Updated Vital Signs BP (!) 139/93   Pulse 65   Temp 98.3 F (36.8 C) (Oral)   Resp (!) 22   Ht 5\' 6"  (1.676 m)   Wt 93 kg   SpO2 99%   BMI 33.09 kg/m   Physical Exam Vitals and nursing note reviewed.  Constitutional:      Appearance: He is well-developed.  HENT:     Head: Normocephalic and atraumatic.     Nose: No congestion or rhinorrhea.     Mouth/Throat:     Mouth: Mucous membranes are moist.     Pharynx: Oropharynx is clear.  Eyes:     Pupils: Pupils are equal, round, and reactive to light.  Cardiovascular:     Rate and Rhythm: Normal rate.  Pulmonary:     Effort: Pulmonary effort is normal. No respiratory distress.  Abdominal:     General: Abdomen is flat. There is no distension.   Genitourinary:    Comments: Refuses rectal exm Musculoskeletal:        General: Normal range of motion.     Cervical back: Normal range of motion.  Skin:    General: Skin is warm and dry.  Neurological:     General: No focal deficit present.     Mental Status: He is alert.    ED Results / Procedures / Treatments   Labs (all labs ordered are listed, but only abnormal results are displayed) Labs Reviewed  COMPREHENSIVE METABOLIC PANEL - Abnormal; Notable for the following components:      Result Value   Potassium 5.8 (*)    Total Bilirubin 1.3 (*)    All other components within normal limits  CBC WITH DIFFERENTIAL/PLATELET    EKG EKG Interpretation  Date/Time:  Tuesday June 20 2021 02:27:03 EDT Ventricular Rate:  69 PR Interval:  163 QRS Duration: 83 QT Interval:  406 QTC Calculation: 435 R Axis:   51 Text Interpretation: Sinus rhythm ST elev, probable normal early repol pattern Confirmed by 04-05-1984 [phone]) on 06/20/2021 2:38:23 AM  Radiology No results found.  Procedures Procedures   Medications Ordered in ED Medications  sodium polystyrene (KAYEXALATE) 15 GM/60ML suspension 30 g (has no administration in time range)  sorbitol, milk of mag, mineral oil, glycerin (SMOG) enema (960 mLs Rectal Given 06/20/21 0109)  lidocaine (XYLOCAINE) 2 % jelly 1 application (1 application Topical Given 06/20/21 0040)  HYDROmorphone (DILAUDID) injection 1 mg (1 mg Intravenous Given 06/20/21 0037)    ED Course  I have reviewed the triage vital signs and the nursing notes.  Pertinent labs & imaging results that were available during my care of the patient were reviewed by me and considered in my medical decision making (see chart for details).    MDM Rules/Calculators/A&P                         Will try enema.   Patient apparently had pretty good results with enema and is feeling better with that.  His potassium came back slightly elevated so we will also give a dose  of kayexelate.  He  will follow-up with his primary doctor to recheck his potassium make sure is not any worse as long as ecg is reassuring. Ecg without obvious peaked T waves. Kayexelate provided, I suspect with that and the miralax over next couple days his potassium will continue to  improve. No indication for admission.   Final Clinical Impression(s) / ED Diagnoses Final diagnoses:  Constipation, unspecified constipation type  Hyperkalemia    Rx / DC Orders ED Discharge Orders          Ordered    polyethylene glycol (MIRALAX / GLYCOLAX) 17 g packet  Daily        06/20/21 0233    magnesium citrate SOLN   Once        06/20/21 0233             Gildardo Tickner, Barbara Cower, MD 06/20/21 [id number]

## 2021-06-20 ENCOUNTER — Other Ambulatory Visit: Payer: Self-pay | Admitting: Internal Medicine

## 2021-06-20 ENCOUNTER — Ambulatory Visit
Admission: RE | Admit: 2021-06-20 | Discharge: 2021-06-20 | Disposition: A | Discharge status: Home / Self Care | Payer: 59 | Source: Ambulatory Visit | Attending: Internal Medicine | Admitting: Internal Medicine

## 2021-06-20 DIAGNOSIS — Z720 Tobacco use: Secondary | ICD-10-CM

## 2021-06-20 DIAGNOSIS — R61 Generalized hyperhidrosis: Secondary | ICD-10-CM

## 2021-06-20 LAB — CBC WITH DIFFERENTIAL/PLATELET
Abs Immature Granulocytes: 0.03 10*3/uL (ref 0.00–0.07)
Basophils Absolute: 0 10*3/uL (ref 0.0–0.1)
Basophils Relative: 0 %
Eosinophils Absolute: 0.1 10*3/uL (ref 0.0–0.5)
Eosinophils Relative: 2 %
HCT: 41.1 % (ref 39.0–52.0)
Hemoglobin: 14.2 g/dL (ref 13.0–17.0)
Immature Granulocytes: 1 %
Lymphocytes Relative: 32 %
Lymphs Abs: 2 10*3/uL (ref 0.7–4.0)
MCH: 32.9 pg (ref 26.0–34.0)
MCHC: 34.5 g/dL (ref 30.0–36.0)
MCV: 95.4 fL (ref 80.0–100.0)
Monocytes Absolute: 0.8 10*3/uL (ref 0.1–1.0)
Monocytes Relative: 13 %
Neutro Abs: 3.1 10*3/uL (ref 1.7–7.7)
Neutrophils Relative %: 52 %
Platelets: 269 10*3/uL (ref 150–400)
RBC: 4.31 MIL/uL (ref 4.22–5.81)
RDW: 13.4 % (ref 11.5–15.5)
WBC: 6 10*3/uL (ref 4.0–10.5)
nRBC: 0 % (ref 0.0–0.2)

## 2021-06-20 LAB — COMPREHENSIVE METABOLIC PANEL
ALT: 22 U/L (ref 0–44)
AST: 35 U/L (ref 15–41)
Albumin: 3.5 g/dL (ref 3.5–5.0)
Alkaline Phosphatase: 53 U/L (ref 38–126)
Anion gap: 8 (ref 5–15)
BUN: 12 mg/dL (ref 6–20)
CO2: 24 mmol/L (ref 22–32)
Calcium: 8.9 mg/dL (ref 8.9–10.3)
Chloride: 104 mmol/L (ref 98–111)
Creatinine, Ser: 1.15 mg/dL (ref 0.61–1.24)
GFR, Estimated: >60 mL/min (ref >60)
Glucose, Bld: 88 mg/dL (ref 70–99)
Potassium: 5.8 mmol/L — ABNORMAL HIGH (ref 3.5–5.1)
Sodium: 136 mmol/L (ref 135–145)
Total Bilirubin: 1.3 mg/dL — ABNORMAL HIGH (ref 0.3–1.2)
Total Protein: 6.6 g/dL (ref 6.5–8.1)

## 2021-06-20 MED ORDER — MAGNESIUM CITRATE PO SOLN: 1.0000 | Freq: Once | ORAL | 5 refills | Status: AC

## 2021-06-20 MED ORDER — POLYETHYLENE GLYCOL 3350 17 G PO PACK
17.0000 g | PACK | Freq: Every day | ORAL | 0 refills | Status: DC
Start: 2021-06-20 — End: 2021-08-15

## 2021-06-20 MED ORDER — SODIUM POLYSTYRENE SULFONATE 15 GM/60ML PO SUSP
30.0000 g | Freq: Once | ORAL | Status: AC
  Administered 2021-06-20: 30 g via ORAL
  Filled 2021-06-20: qty 120

## 2021-06-20 NOTE — Discharge Instructions (Signed)

## 2021-06-30 ENCOUNTER — Other Ambulatory Visit (HOSPITAL_COMMUNITY): Payer: Self-pay | Admitting: General Surgery

## 2021-06-30 DIAGNOSIS — A630 Anogenital (venereal) warts: Secondary | ICD-10-CM

## 2021-06-30 MED ORDER — OXYCODONE HCL 5 MG PO TABS: 5.0000 mg | ORAL_TABLET | Freq: Four times a day (QID) | ORAL | 0 refills | Status: DC | PRN

## 2021-06-30 MED ORDER — SILVER SULFADIAZINE 1 % EX CREA: TOPICAL_CREAM | CUTANEOUS | 1 refills | Status: DC

## 2021-08-15 ENCOUNTER — Encounter: Payer: Self-pay | Admitting: Neurology

## 2021-08-15 ENCOUNTER — Ambulatory Visit (INDEPENDENT_AMBULATORY_CARE_PROVIDER_SITE_OTHER): Payer: 59 | Admitting: Neurology

## 2021-08-15 VITALS — BP 126/86 | HR 86 | Ht 66.0 in | Wt 211.6 lb

## 2021-08-15 DIAGNOSIS — H5712 Ocular pain, left eye: Secondary | ICD-10-CM | POA: Diagnosis not present

## 2021-08-15 DIAGNOSIS — H9192 Unspecified hearing loss, left ear: Secondary | ICD-10-CM

## 2021-08-15 DIAGNOSIS — H44002 Unspecified purulent endophthalmitis, left eye: Secondary | ICD-10-CM | POA: Diagnosis not present

## 2021-08-15 DIAGNOSIS — R220 Localized swelling, mass and lump, head: Secondary | ICD-10-CM | POA: Diagnosis not present

## 2021-08-15 DIAGNOSIS — H5789 Other specified disorders of eye and adnexa: Secondary | ICD-10-CM

## 2021-08-15 DIAGNOSIS — H6592 Unspecified nonsuppurative otitis media, left ear: Secondary | ICD-10-CM

## 2021-08-15 NOTE — Patient Instructions (Signed)
MRI brain and MRI eyes/orbits You have requested Kelfex,steroids, I cannot provide since I do not know the etiology of symptoms, See Dr. Nehemiah Settle today for steroids and.or keflex per Dr. Nehemiah Settle

## 2021-08-15 NOTE — Progress Notes (Signed)
[facility]    Provider:  Dr Lucia Gaskins Requesting Provider: Sheran Luz, MD Primary Care Provider:  Renford Dills, MD  CC:  left eye swelling, drainage, facial swelling  HPI:  Ricardo [name] is a 40 y.o. male here as requested by Sheran Luz, MD for atypical facial pain. He has chronic neck and shoulder pain, he is managed by [facility], MRI cervical spine was completed in 08/2020, no significant findings to explain the symptoms in yir arm and shoulder, repeat [facility] week ago at [facility]. He goes to the chiroprector as well, 608 months ago he felt pain in the bottom of is eyelid and it swelled up, he has swelling and crust in his left eye, he can puch on the back of his neck and feel it in his eyelid, his eye is leaking all day, the corner of his mouth, his cheeks. He was talking to Dr. Nehemiah Settle the other day and he tried prednisone, he has submandibular glands that occ swell, prednisone and keflex helped with the eye symptoms and fluid in the ear and gland. He is sweating a lot all over the whole body. No stabbing pain in the face, but feels like he has swelling in the lft side of his face, he feels fluid behind the eye and behind the ear, eye pain, tearing and leaking out pus out of the eye. No headache or temporal pain, not migrainous, no migraine symptoms. Ongoing for 2 years. No other focal neurologic deficits, associated symptoms, inciting events or modifiable factors. He would like prednisone and keflex. No other focal neurologic deficits, associated symptoms, inciting events or modifiable factors.swelling on the left eye and face only, sweating bilaterally, no ptosis.  Reviewed notes, labs and imaging from outside physicians, which showed:  I reviewed Dr. Cecilie Lowers notes, he was seen for left-sided neck pain that radiates down the left arm with some numbness and tingling, he takes gabapentin, meloxicam, methocarbamol, tizanidine and uses Dyclone gel, he had an MRI of his cervical  spine in December 2021, continuing to have pain in the left side of his neck which is severe, it goes up into his face, it actually affects his lower leg, swelling in the eyelid, increased pain with activity with his neck and left posterior shoulder, the swelling returns, now new onset along the left mouth with numbness, feels like he is dribbling water from that side sometimes, also experiences pain in his left foot which occurs only when he is left neck is flared, trigger point injection in the left posterior paracervical musculature for severe pain helped tremendously and the pain is not returned, he is now noticing pain with spasm along the upper traps on the left as well as the muscles along the anterior neck.  MRI of the cervical spine in December 21 showed a hypoplastic or absent posterior arch without significant spinal canal stenosis at C1 to minimal broad right central disc osteophyte complex at C5-C6 without deformity of the cord or spinal stenosis.  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 Horald Chestnut.  No foraminal stenosis on the left side that would indicate is coming from the nerve root.  For his mouth involvement and the pain in his ear consider a stellate ganglion injection.  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  IMPRESSION:MRI cervical spine 2020 reviewed images and agree  1. No disc herniation or evidence of neural impingement in the cervical spine. 2. Indeterminate 2 cm right anterior neck mass. Contrast-enhanced neck CT is recommended for further evaluation.  Review of Systems: Patient complains of symptoms per HPI as well as the following symptoms  facial/eye pain. Pertinent negatives and positives per HPI. All others negative.   Social History   Socioeconomic History   Marital status: Single    Spouse name: Not on file   Number of children: 3   Years of education: 12   Highest education level: High school graduate  Occupational History   Occupation: owns Regulatory affairs officer  Tobacco Use   Smoking status: Some Days    Types: Cigars   Smokeless tobacco: Never  Vaping Use   Vaping Use: Never used  Substance and Sexual Activity   Alcohol use: Yes    Alcohol/week: 7.0 standard drinks    Types: 3 Cans of beer, 4 Shots of liquor per week   Drug use: No   Sexual activity: Not on file  Other Topics Concern   Not on file  Social History Narrative   Lives with parents.   Right-handed.   No daily use of caffeine.   Social Determinants of Health   Financial Resource Strain: Not on file  Food Insecurity: Not on file  Transportation Needs: Not on file  Physical Activity: Not on file  Stress: Not on file  Social Connections: Not on file  Intimate Partner Violence: Not on file    Family History  Problem Relation Age of Onset   Irritable bowel syndrome Sister    Diabetes Sister    Hyperlipidemia Sister    Hypertension Sister    Hypertension Mother    Hypertension Father    Hyperlipidemia Father     Past Medical History:  Diagnosis Date   Facial pain    able to eat and swallow.06/05/2021   Hypertension    pt has run out of high blood pressure and is not taking anything for his hypertension.  needs to schedule an appointment with dr. 06/05/2021   Neck pain    able to move his neck with out any problems. 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 [place];  Service: General;  Laterality: N/A;   no past surgery     RECTAL EXAM UNDER ANESTHESIA N/A 06/08/2021   Procedure: RECTAL EXAM UNDER ANESTHESIA;  Surgeon: Romie Levee, MD;  Location: [facility] [place];  Service: General;  Laterality: N/A;   SKIN LESION EXCISION     30 yrs ago had boil removed from his butt. 06/05/2021    Current Outpatient Medications  Medication Sig Dispense Refill   amLODipine (NORVASC) 5 MG tablet Take 5 mg by mouth daily.     atorvastatin (LIPITOR) 10 MG tablet Take 10 mg by mouth daily.     cephALEXin (KEFLEX) 250 MG capsule Take 250 mg by mouth as needed.  predniSONE (STERAPRED UNI-PAK 21 TAB) 5 MG (21) TBPK tablet Take 5 mg by mouth See admin instructions. follow package directions     No current facility-administered medications for this visit.    Allergies as of 08/15/2021 - Review Complete 08/15/2021  Allergen Reaction Noted   Latex Rash 06/05/2021    Vitals: BP 126/86   Pulse 86   Ht 5\' 6"  (1.676 m)   Wt 211 lb 9.6 oz (96 kg)   BMI 34.15 kg/m  Last Weight:  Wt Readings from Last 1 Encounters:  08/15/21 211 lb 9.6 oz (96 kg)   Last Height:   Ht Readings from Last 1 Encounters:  08/15/21 5\' 6"  (1.676 m)     Physical exam: Exam: Gen: NAD, conversant, well nourised, obese, well groomed                     CV: RRR, no MRG. No Carotid Bruits. No peripheral edema, warm, nontender Eyes: swelling on the left lower lid, drainage left eye, facial swelling on the left, right submandibular lymph node enlargement  Neuro: Detailed Neurologic Exam  Speech:    Speech is normal; fluent and spontaneous with normal comprehension.  Cognition:    The patient is oriented to person, place, and time;     recent and remote memory intact;     language fluent;     normal attention, concentration,     fund of  knowledge Cranial Nerves:    The pupils are equal, round, and reactive to light. Pupils too small to visualize fundi. Visual fields are full to finger confrontation. Extraocular movements are intact. Trigeminal sensation is intact and the muscles of mastication are normal. The face is symmetric. The palate elevates in the midline. Hearing intact. Voice is normal. Shoulder shrug is normal. The tongue has normal motion without fasciculations.   Coordination:    Normal .   Gait:     normal.   Motor Observation:    No asymmetry, no atrophy, and no involuntary movements noted. Tone:    Normal muscle tone.    Posture:    Posture is normal. normal erect    Strength:    Strength is V/V in the upper and lower limbs.      Sensation: intact to LT     Reflex Exam:  DTR's:    Deep tendon reflexes in the upper and lower extremities are normal bilaterally.   Toes:    The toes are downgoing bilaterally.   Clonus:    Clonus is absent.    Assessment/Plan:  This is a patient with atypical facial symptoms. He has swelling and pain of the left eye (cellulitis, head/neck cancer, orbital pseudotumor or other?), left facial swelling, fluid in the left ear with hearing loss, submandibular gland enlargement. Need to check MRI of the brain for head/neck cancer (is getting imaging of the neck from [facility]), and MRI orbits with thin cuts through the globes to look for other causes like orbital pseudotumor, cellulitsis, tumor. He is asking for keflex and steroids but since I do not know what the etiology is of his symptoms I am not in a position to treat this. I recommend he see his primary care today while I order the imaging. CMP and CBC unremarkable. Ongoing for > 1 year, he has seen multiple specialists per patient including eye doctor, ENT, ortho, chiro.   - This is not a primary headache disorder, will check and if negative he will have to  return to ortho and primary care and likely seek  ophthalmology and/or ENT.   Orders Placed This Encounter  Procedures   MR BRAIN W WO CONTRAST   MR ORBITS W WO CONTRAST   No orders of the defined types were placed in this encounter.   Cc: Sheran Luz, MD,  Renford Dills, MD  Naomie Dean, MD  [facility] [address] [place], [place] [address]  Phone [phone] Fax [phone]

## 2021-08-22 ENCOUNTER — Other Ambulatory Visit: Payer: Self-pay

## 2021-08-22 ENCOUNTER — Ambulatory Visit
Admission: RE | Admit: 2021-08-22 | Discharge: 2021-08-22 | Disposition: A | Discharge status: Home / Self Care | Payer: 59 | Source: Ambulatory Visit | Attending: Neurology | Admitting: Neurology

## 2021-08-22 DIAGNOSIS — H44002 Unspecified purulent endophthalmitis, left eye: Secondary | ICD-10-CM

## 2021-08-22 DIAGNOSIS — H5789 Other specified disorders of eye and adnexa: Secondary | ICD-10-CM

## 2021-08-22 DIAGNOSIS — H9192 Unspecified hearing loss, left ear: Secondary | ICD-10-CM

## 2021-08-22 DIAGNOSIS — H5712 Ocular pain, left eye: Secondary | ICD-10-CM

## 2021-08-22 DIAGNOSIS — H6592 Unspecified nonsuppurative otitis media, left ear: Secondary | ICD-10-CM

## 2021-08-22 DIAGNOSIS — R220 Localized swelling, mass and lump, head: Secondary | ICD-10-CM

## 2021-08-22 MED ORDER — GADOBENATE DIMEGLUMINE 529 MG/ML IV SOLN
20.0000 mL | Freq: Once | INTRAVENOUS | Status: AC | PRN
  Administered 2021-08-22: 20 mL via INTRAVENOUS

## 2021-08-24 ENCOUNTER — Telehealth: Payer: Self-pay | Admitting: *Deleted

## 2021-08-24 NOTE — Telephone Encounter (Signed)
Spoke to patient about MRI results. Pt wanted another appointment to see Dr. Lucia Gaskins. Looked back in notes didn't see any note for him to return for a follow up appointment. Will send to Dr.Ahern to see what her next steps are for the patient.

## 2021-08-24 NOTE — Progress Notes (Signed)
See phone note

## 2021-08-29 NOTE — Telephone Encounter (Signed)
Spoke to Patient to make him aware that Dr. Lucia Gaskins stated he didn't need a follow up appointment his MRI was normal and for patient to report back to PCP. Pt states thanks and hung up.

## 2021-10-31 ENCOUNTER — Encounter: Payer: Self-pay | Admitting: Orthopaedic Surgery

## 2021-10-31 ENCOUNTER — Other Ambulatory Visit: Payer: Self-pay

## 2021-10-31 ENCOUNTER — Ambulatory Visit (INDEPENDENT_AMBULATORY_CARE_PROVIDER_SITE_OTHER): Payer: 59

## 2021-10-31 ENCOUNTER — Ambulatory Visit (INDEPENDENT_AMBULATORY_CARE_PROVIDER_SITE_OTHER): Payer: 59 | Admitting: Orthopaedic Surgery

## 2021-10-31 VITALS — Ht 66.0 in | Wt 211.0 lb

## 2021-10-31 DIAGNOSIS — R519 Headache, unspecified: Secondary | ICD-10-CM | POA: Diagnosis not present

## 2021-10-31 DIAGNOSIS — M542 Cervicalgia: Secondary | ICD-10-CM

## 2021-10-31 MED ORDER — METHYLPREDNISOLONE 4 MG PO TBPK: ORAL_TABLET | ORAL | 0 refills | Status: DC
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
## 2021-10-31 NOTE — Addendum Note (Signed)
Addended by: Wendi Maya on: 10/31/2021 04:27 PM   Modules accepted: Orders

## 2021-11-01 ENCOUNTER — Encounter: Payer: Self-pay | Admitting: Neurology

## 2021-11-06 ENCOUNTER — Other Ambulatory Visit: Payer: Self-pay

## 2021-11-06 ENCOUNTER — Encounter: Payer: Self-pay | Admitting: Physical Medicine and Rehabilitation

## 2021-11-06 ENCOUNTER — Ambulatory Visit (INDEPENDENT_AMBULATORY_CARE_PROVIDER_SITE_OTHER): Payer: 59 | Admitting: Physical Medicine and Rehabilitation

## 2021-11-06 VITALS — BP 152/109 | HR 111

## 2021-11-06 DIAGNOSIS — M7918 Myalgia, other site: Secondary | ICD-10-CM | POA: Diagnosis not present

## 2021-11-06 DIAGNOSIS — H5789 Other specified disorders of eye and adnexa: Secondary | ICD-10-CM | POA: Diagnosis not present

## 2021-11-06 DIAGNOSIS — M542 Cervicalgia: Secondary | ICD-10-CM | POA: Diagnosis not present

## 2021-11-06 DIAGNOSIS — M5412 Radiculopathy, cervical region: Secondary | ICD-10-CM | POA: Diagnosis not present
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
## 2021-11-06 NOTE — Progress Notes (Signed)
Pt state neck pain that travels to his left shoulder. Pt state pain can come from his neck to his left side of his face. Pt state driving or anything with his left arm can make the pain worse. Pt state pain meds and heat to help ease the pain.  Numeric Pain Rating Scale and Functional Assessment Average Pain 10 Pain Right Now 4 My pain is constant, tingling, and aching Pain is worse with: bending and sitting Pain improves with: heat/ice and medication   In the last MONTH (on 0-10 scale) has pain interfered with the following?  1. General activity like being  able to carry out your everyday physical activities such as walking, climbing stairs, carrying groceries, or moving a chair?  Rating(6)  2. Relation with others like being able to carry out your usual social activities and roles such as  activities at home, at work and in your community. Rating(7)  3. Enjoyment of life such that you have  been bothered by emotional problems such as feeling anxious, depressed or irritable?  Rating(8)

## 2021-11-10 ENCOUNTER — Ambulatory Visit (INDEPENDENT_AMBULATORY_CARE_PROVIDER_SITE_OTHER): Payer: 59

## 2021-11-10 ENCOUNTER — Other Ambulatory Visit: Payer: Self-pay

## 2021-11-10 ENCOUNTER — Ambulatory Visit
Admission: EM | Admit: 2021-11-10 | Discharge: 2021-11-10 | Disposition: A | Discharge status: Home / Self Care | Payer: 59 | Attending: Physician Assistant | Admitting: Physician Assistant

## 2021-11-10 DIAGNOSIS — R059 Cough, unspecified: Secondary | ICD-10-CM

## 2021-11-10 DIAGNOSIS — R0989 Other specified symptoms and signs involving the circulatory and respiratory systems: Secondary | ICD-10-CM | POA: Diagnosis not present

## 2021-11-10 DIAGNOSIS — J069 Acute upper respiratory infection, unspecified: Secondary | ICD-10-CM

## 2021-11-10 DIAGNOSIS — R509 Fever, unspecified: Secondary | ICD-10-CM

## 2021-11-10 LAB — POCT INFLUENZA A/B
Influenza A, POC: NEGATIVE
Influenza B, POC: NEGATIVE

## 2021-11-10 MED ORDER — ACETAMINOPHEN 325 MG PO TABS
650.0000 mg | ORAL_TABLET | Freq: Once | ORAL | Status: AC
  Administered 2021-11-10: 650 mg via ORAL
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
## 2021-11-10 NOTE — ED Triage Notes (Signed)
1.5 wk h/o intermittent cough, congestion, runny nose and two days of fatigue, chills and body aches. Has been taking alka seltzer plus with minimal relief. No v/d.

## 2021-11-11 LAB — COVID-19, FLU A+B NAA
Influenza A, NAA: NOT DETECTED
Influenza B, NAA: NOT DETECTED
SARS-CoV-2, NAA: DETECTED — AB

## 2021-11-16 ENCOUNTER — Ambulatory Visit: Payer: 59 | Admitting: Physical Medicine and Rehabilitation

## 2021-12-04 DIAGNOSIS — M5412 Radiculopathy, cervical region: Secondary | ICD-10-CM | POA: Diagnosis not present

## 2021-12-04 DIAGNOSIS — M25512 Pain in left shoulder: Secondary | ICD-10-CM | POA: Diagnosis not present

## 2021-12-04 DIAGNOSIS — G501 Atypical facial pain: Secondary | ICD-10-CM | POA: Diagnosis not present

## 2021-12-04 DIAGNOSIS — M89012 Algoneurodystrophy, left shoulder: Secondary | ICD-10-CM | POA: Diagnosis not present

## 2022-01-02 DIAGNOSIS — M5416 Radiculopathy, lumbar region: Secondary | ICD-10-CM | POA: Insufficient documentation

## 2022-01-10 DIAGNOSIS — K600 Acute anal fissure: Secondary | ICD-10-CM | POA: Diagnosis not present

## 2022-01-10 DIAGNOSIS — A630 Anogenital (venereal) warts: Secondary | ICD-10-CM | POA: Diagnosis not present

## 2022-01-10 DIAGNOSIS — R69 Illness, unspecified: Secondary | ICD-10-CM | POA: Diagnosis not present

## 2022-01-16 DIAGNOSIS — M5412 Radiculopathy, cervical region: Secondary | ICD-10-CM | POA: Diagnosis not present
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
## 2022-01-24 ENCOUNTER — Ambulatory Visit: Payer: 59 | Admitting: Neurology

## 2022-01-24 ENCOUNTER — Encounter: Payer: Self-pay | Admitting: Neurology

## 2022-01-24 VITALS — BP 148/103 | HR 98 | Ht 66.0 in | Wt 213.8 lb

## 2022-01-24 DIAGNOSIS — M542 Cervicalgia: Secondary | ICD-10-CM

## 2022-01-24 DIAGNOSIS — M5412 Radiculopathy, cervical region: Secondary | ICD-10-CM | POA: Diagnosis not present

## 2022-01-24 DIAGNOSIS — M7918 Myalgia, other site: Secondary | ICD-10-CM

## 2022-01-24 NOTE — Patient Instructions (Signed)
1 I would like to order a nerve study of the left arm to test for any abnormalities in the nerves from the neck or arm.  Further recommendations pending results.  However, I don't have any further recommendations in regards to treatment.   ?

## 2022-02-20 ENCOUNTER — Encounter: Payer: Self-pay | Admitting: Emergency Medicine

## 2022-02-20 ENCOUNTER — Other Ambulatory Visit: Payer: Self-pay

## 2022-02-20 ENCOUNTER — Ambulatory Visit
Admission: EM | Admit: 2022-02-20 | Discharge: 2022-02-20 | Disposition: A | Discharge status: Home / Self Care | Payer: 59 | Attending: Internal Medicine | Admitting: Internal Medicine

## 2022-02-20 DIAGNOSIS — G8929 Other chronic pain: Secondary | ICD-10-CM

## 2022-02-20 DIAGNOSIS — H02846 Edema of left eye, unspecified eyelid: Secondary | ICD-10-CM

## 2022-02-20 DIAGNOSIS — J069 Acute upper respiratory infection, unspecified: Secondary | ICD-10-CM

## 2022-02-20 DIAGNOSIS — H65192 Other acute nonsuppurative otitis media, left ear: Secondary | ICD-10-CM | POA: Diagnosis not present

## 2022-02-20 DIAGNOSIS — M25512 Pain in left shoulder: Secondary | ICD-10-CM | POA: Diagnosis not present

## 2022-02-20 MED ORDER — KETOROLAC TROMETHAMINE 30 MG/ML IJ SOLN
30.0000 mg | Freq: Once | INTRAMUSCULAR | Status: AC
  Administered 2022-02-20: 30 mg via INTRAMUSCULAR

## 2022-02-20 MED ORDER — CEFDINIR 300 MG PO CAPS: 300.0000 mg | ORAL_CAPSULE | Freq: Two times a day (BID) | ORAL | 0 refills | Status: AC

## 2022-02-20 NOTE — ED Triage Notes (Signed)
Pt sts left shoulder and neck pain that is chronic in nature worse over last few days with swelling and inflammation into left face and leaking from left eye; pt sts left ear pain

## 2022-02-20 NOTE — Discharge Instructions (Signed)
You have an ear infection which is being treated with an antibiotic.  Please follow-up with eye doctor at provided contact information tomorrow for further evaluation and management.  You were given an injection in urgent care today for pain.  Please avoid taking any ibuprofen, Advil, Aleve for at least 24 hours.  Follow-up with neurology and orthopedist for further evaluation and management of chronic pain.
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
## 2022-03-01 DIAGNOSIS — R69 Illness, unspecified: Secondary | ICD-10-CM | POA: Diagnosis not present

## 2022-04-12 DIAGNOSIS — R69 Illness, unspecified: Secondary | ICD-10-CM | POA: Diagnosis not present

## 2022-05-01 DIAGNOSIS — M5412 Radiculopathy, cervical region: Secondary | ICD-10-CM | POA: Diagnosis not present

## 2022-05-01 DIAGNOSIS — M89012 Algoneurodystrophy, left shoulder: Secondary | ICD-10-CM | POA: Diagnosis not present

## 2022-05-16 DIAGNOSIS — R69 Illness, unspecified: Secondary | ICD-10-CM | POA: Diagnosis not present

## 2022-05-30 DIAGNOSIS — G8929 Other chronic pain: Secondary | ICD-10-CM | POA: Diagnosis not present

## 2022-05-30 DIAGNOSIS — M47812 Spondylosis without myelopathy or radiculopathy, cervical region: Secondary | ICD-10-CM | POA: Diagnosis not present

## 2022-05-30 DIAGNOSIS — M25512 Pain in left shoulder: Secondary | ICD-10-CM | POA: Diagnosis not present

## 2022-05-30 DIAGNOSIS — M542 Cervicalgia: Secondary | ICD-10-CM | POA: Diagnosis not present

## 2022-05-30 DIAGNOSIS — M5412 Radiculopathy, cervical region: Secondary | ICD-10-CM | POA: Diagnosis not present

## 2022-05-30 DIAGNOSIS — H5712 Ocular pain, left eye: Secondary | ICD-10-CM | POA: Diagnosis not present

## 2022-06-04 DIAGNOSIS — M5412 Radiculopathy, cervical region: Secondary | ICD-10-CM | POA: Diagnosis not present

## 2022-06-04 DIAGNOSIS — M542 Cervicalgia: Secondary | ICD-10-CM | POA: Diagnosis not present

## 2022-06-05 ENCOUNTER — Ambulatory Visit (INDEPENDENT_AMBULATORY_CARE_PROVIDER_SITE_OTHER): Payer: 59 | Admitting: Neurology

## 2022-06-05 DIAGNOSIS — M542 Cervicalgia: Secondary | ICD-10-CM | POA: Diagnosis not present

## 2022-06-05 NOTE — Procedures (Signed)
[facility]  [address] [place], [address]  [place], [place] [address] Tel: [phone] Fax:  [phone] Test Date:  06/05/2022  Patient: Ricardo [name] DOB: October 29, 1980 Physician: Nita Sickle, DO  Sex: Male Height: 5\' 6"  Ref Phys: , D.O.  ID#: Shon Millet   Technician:    Patient Complaints: This is a 41 year old man referred for evaluation of left arm pain.  NCV & EMG Findings: Electrodiagnostic testing was limited to nerve conduction study, as patient was unable to tolerate needle electrode examination.  Findings are as follows: Left median, ulnar, radial, left medial antebrachial, and left lateral antebrachial cutaneous sensory responses are within normal limits. Left median and ulnar motor responses are within normal limits.  Impression: This is an incomplete study, as needle electrode examination was not performed at patient's request due to pain.  Nerve conduction study is normal, making brachial plexopathy unlikely.   ___________________________ 44, DO    Nerve Conduction Studies Anti Sensory Summary Table   Stim Site NR Peak (ms) Norm Peak (ms) O-P Amp (V) Norm O-P Amp  Left Lat Ante Brach Cutan Anti Sensory (Lat Forearm)  35C  Lat Biceps    1.9 <2.9 18.7 >14  Left Med Ante Brach Cutan Anti Sensory (Med Forearm)  35C  Elbow    2.2  15.2   Left Median Anti Sensory (2nd Digit)  35C  Wrist    2.9 <3.4 49.4 >20  Left Radial Anti Sensory (Base 1st Digit)  35C  Wrist    2.4 <2.7 23.8 >18  Left Ulnar Anti Sensory (5th Digit)  35C  Wrist    2.9 <3.1 23.8 >12   Motor Summary Table   Stim Site NR Onset (ms) Norm Onset (ms) O-P Amp (mV) Norm O-P Amp Site1 Site2 Delta-0 (ms) Dist (cm) Vel (m/s) Norm Vel (m/s)  Left Median Motor (Abd Poll Brev)  35C  Wrist    2.7 <3.9 9.1 >6 Elbow Wrist 5.9 31.0 53 >50  Elbow    8.6  8.5         Left Ulnar Motor (Abd Dig Minimi)  35C  Wrist    2.6 <3.1 7.8 >7 B Elbow Wrist 3.6 23.0 64 >50  B Elbow     6.2  6.6  A Elbow B Elbow 1.6 10.0 63 >50  A Elbow    7.8  6.2          EMG   Side Muscle Ins Act Fibs Fasc Recrt Dur. Amp. Poly. Activation Comment  Left 1stDorInt Nml Nml Nml Nml Nml Nml Nml Nml N/A      Waveforms:

## 2022-06-20 DIAGNOSIS — R69 Illness, unspecified: Secondary | ICD-10-CM | POA: Diagnosis not present

## 2022-07-10 DIAGNOSIS — M5412 Radiculopathy, cervical region: Secondary | ICD-10-CM | POA: Diagnosis not present

## 2022-07-10 DIAGNOSIS — M542 Cervicalgia: Secondary | ICD-10-CM | POA: Diagnosis not present

## 2022-07-10 DIAGNOSIS — M791 Myalgia, unspecified site: Secondary | ICD-10-CM | POA: Diagnosis not present

## 2022-07-10 DIAGNOSIS — M47812 Spondylosis without myelopathy or radiculopathy, cervical region: Secondary | ICD-10-CM | POA: Diagnosis not present

## 2022-07-11 DIAGNOSIS — M47812 Spondylosis without myelopathy or radiculopathy, cervical region: Secondary | ICD-10-CM | POA: Insufficient documentation

## 2022-08-07 DIAGNOSIS — A630 Anogenital (venereal) warts: Secondary | ICD-10-CM | POA: Insufficient documentation

## 2022-08-07 DIAGNOSIS — R69 Illness, unspecified: Secondary | ICD-10-CM | POA: Diagnosis not present

## 2022-08-21 ENCOUNTER — Other Ambulatory Visit: Payer: Self-pay

## 2022-08-21 ENCOUNTER — Emergency Department (HOSPITAL_COMMUNITY): Payer: 59

## 2022-08-21 ENCOUNTER — Emergency Department (HOSPITAL_COMMUNITY)
Admission: EM | Admit: 2022-08-21 | Discharge: 2022-08-21 | Disposition: A | Discharge status: Home / Self Care | Payer: 59 | Attending: Emergency Medicine | Admitting: Emergency Medicine

## 2022-08-21 DIAGNOSIS — R059 Cough, unspecified: Secondary | ICD-10-CM | POA: Diagnosis not present

## 2022-08-21 DIAGNOSIS — R0781 Pleurodynia: Secondary | ICD-10-CM | POA: Insufficient documentation

## 2022-08-21 DIAGNOSIS — Z91040 Latex allergy status: Secondary | ICD-10-CM | POA: Insufficient documentation

## 2022-08-21 DIAGNOSIS — I10 Essential (primary) hypertension: Secondary | ICD-10-CM | POA: Insufficient documentation

## 2022-08-21 DIAGNOSIS — Z79899 Other long term (current) drug therapy: Secondary | ICD-10-CM | POA: Insufficient documentation

## 2022-08-21 MED ORDER — LIDOCAINE 5 % EX PTCH
1.0000 | MEDICATED_PATCH | Freq: Every day | CUTANEOUS | Status: DC
  Administered 2022-08-21: 1 via TRANSDERMAL
  Filled 2022-08-21: qty 1

## 2022-08-21 MED ORDER — LIDOCAINE 5 % EX PTCH
1.0000 | MEDICATED_PATCH | Freq: Every day | CUTANEOUS | Status: DC
  Administered 2022-08-21: 1 via TRANSDERMAL
  Filled 2022-08-21 (×2): qty 1

## 2022-08-21 MED ORDER — IBUPROFEN 400 MG PO TABS
600.0000 mg | ORAL_TABLET | Freq: Once | ORAL | Status: AC
  Administered 2022-08-21: 600 mg via ORAL
  Filled 2022-08-21: qty 1

## 2022-08-21 MED ORDER — LIDOCAINE 5 % EX PTCH
1.0000 | MEDICATED_PATCH | CUTANEOUS | 0 refills | Status: DC
Start: 2022-08-21 — End: 2024-05-04

## 2022-08-21 MED ORDER — METHOCARBAMOL 500 MG PO TABS: 500.0000 mg | ORAL_TABLET | Freq: Two times a day (BID) | ORAL | 0 refills | Status: DC | PRN

## 2022-08-21 MED ORDER — HYDROCODONE-ACETAMINOPHEN 5-325 MG PO TABS
1.0000 | ORAL_TABLET | Freq: Once | ORAL | Status: AC
  Administered 2022-08-21: 1 via ORAL
  Filled 2022-08-21: qty 1
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
## 2022-08-21 NOTE — ED Provider Notes (Signed)
[facility] EMERGENCY DEPARTMENT Provider Note   CSN: [id number] Arrival date & time: 08/21/22  0005     History  Chief Complaint  Patient presents with   Rib Injury    DWAN HEMMELGARN is a 41 y.o. male with chronic cough secondary to smoke who presents with concern for sudden onset right rib pain that started while he was coughing this morning.  Pain worsened with movement throughout the day, significantly worsened this evening after coughing spell.  States that he is not coughing anymore than his normal at his baseline.  No other infectious symptoms.  No fevers or chills.  No sick contacts.  No history of similar pain in the past..  10 of 10, has not taken any medication at home for this.  I personally reviewed the patient's medical records.  He has history of IBS, ulcerative proctitis, hypertension.  States he is not taking any medications daily though.  She is post to be taking amlodipine daily as well as atorvastatin.  HPI     Home Medications Prior to Admission medications   Medication Sig Start Date End Date Taking? Authorizing Provider  amLODipine (NORVASC) 5 MG tablet Take 5 mg by mouth daily. Patient not taking: Reported on 01/24/2022    [provider]  atorvastatin (LIPITOR) 10 MG tablet Take 10 mg by mouth daily. Patient not taking: Reported on 01/24/2022 07/27/21   [provider]  cephALEXin (KEFLEX) 250 MG capsule Take 250 mg by mouth as needed. Patient not taking: Reported on 01/24/2022 07/27/21   [provider]  gabapentin (NEURONTIN) 300 MG capsule gabapentin 300 mg capsule Patient not taking: Reported on 01/24/2022    [provider]  meloxicam (MOBIC) 7.5 MG tablet meloxicam 7.5 mg tablet Patient not taking: Reported on 01/24/2022    [provider]  methocarbamol (ROBAXIN) 500 MG tablet methocarbamol 500 mg tablet Patient not taking: Reported on 01/24/2022    [provider]  methylPREDNISolone  (MEDROL DOSEPAK) 4 MG TBPK tablet Take as directed on package. Patient not taking: Reported on 01/24/2022 10/31/21   Valeria Batman, MD  predniSONE (STERAPRED UNI-PAK 21 TAB) 5 MG (21) TBPK tablet Take 5 mg by mouth See admin instructions. follow package directions Patient not taking: Reported on 01/24/2022 08/02/21   [provider]      Allergies    Latex    Review of Systems   Review of Systems  Constitutional: Negative.   HENT: Negative.    Respiratory:  Positive for cough. Negative for chest tightness and shortness of breath.   Cardiovascular:  Negative for palpitations and leg swelling.       Right-sided chest wall pain and muscle spasm  Gastrointestinal: Negative.     Physical Exam Updated Vital Signs BP (!) 160/109 (BP Location: Right Arm)   Pulse (!) 101   Temp 98.7 F (37.1 C) (Oral)   Resp 18   SpO2 96%  Physical Exam Vitals and nursing note reviewed.  Constitutional:      Appearance: He is obese. He is not ill-appearing or toxic-appearing.  HENT:     Head: Normocephalic and atraumatic.     Nose: Nose normal.     Mouth/Throat:     Mouth: Mucous membranes are moist.     Pharynx: No oropharyngeal exudate or posterior oropharyngeal erythema.  Eyes:     General:        Right eye: No discharge.        Left eye:  No discharge.     Extraocular Movements: Extraocular movements intact.     Conjunctiva/sclera: Conjunctivae normal.     Pupils: Pupils are equal, round, and reactive to light.  Cardiovascular:     Rate and Rhythm: Normal rate and regular rhythm.     Pulses: Normal pulses.     Heart sounds: Normal heart sounds. No murmur heard. Pulmonary:     Effort: Pulmonary effort is normal. No tachypnea, bradypnea, accessory muscle usage, prolonged expiration or respiratory distress.     Breath sounds: Normal breath sounds. No wheezing or rales.  Abdominal:     General: Bowel sounds are normal. There is no distension.     Palpations: Abdomen is soft.      Tenderness: There is no abdominal tenderness. There is no guarding or rebound.  Musculoskeletal:        General: No deformity.       Arms:     Cervical back: Neck supple. No tenderness.     Right lower leg: No edema.     Left lower leg: No edema.     Comments: Full range of motion of the arm on the right.  Lymphadenopathy:     Cervical: No cervical adenopathy.  Skin:    General: Skin is warm and dry.     Capillary Refill: Capillary refill takes less than 2 seconds.  Neurological:     General: No focal deficit present.     Mental Status: He is alert and oriented to person, place, and time. Mental status is at baseline.  Psychiatric:        Mood and Affect: Mood normal.     ED Results / Procedures / Treatments   Labs (all labs ordered are listed, but only abnormal results are displayed) Labs Reviewed - No data to display  EKG None  Radiology DG Ribs Unilateral W/Chest Right  Result Date: 08/21/2022 CLINICAL DATA:  Sharp rib pain on lower right side after cough EXAM: RIGHT RIBS AND CHEST - 3+ VIEW COMPARISON:  Radiographs 11/10/2021 FINDINGS: The area of symptomatic concern as indicated by the patient was denoted with a metallic skin BB by the technologist. No fracture or other bone lesions are seen involving the ribs. There is no evidence of pneumothorax or pleural effusion. Both lungs are clear. Heart size and mediastinal contours are within normal limits. IMPRESSION: No displaced rib fractures. Electronically Signed   By: Minerva Fester M.D.   On: 08/21/2022 00:51    Procedures Procedures    Medications Ordered in ED Medications  lidocaine (LIDODERM) 5 % 1 patch (1 patch Transdermal Patch Applied 08/21/22 0046)  ibuprofen (ADVIL) tablet 600 mg (600 mg Oral Given 08/21/22 0045)    ED Course/ Medical Decision Making/ A&P                           Medical Decision Making 41 year old male presents with concern for sudden onset right rib pain that started while coughing  today.  Hypertensive on intake, very mild tachycardia with heart rate of 101, likely secondary to pain.  Heart is normal at time my evaluation.  Lungs CTA B.  Spasm of the muscles in the right chest wall lateral aspect of the stomach palpation without any skin changes, redness, induration, fluctuance, bruising, or crepitus.  Differential diagnosis includes not limited to pneumothorax, pneumonia, muscle spasm, rib fracture or subluxation.  Amount and/or Complexity of Data Reviewed Radiology: ordered and independent interpretation performed.  Details: CXR without acute cardiopulmonary disease or osseous abnormality. Visualized by this provider, agree with radiologist.   Risk Prescription drug management.    Clinical patient most consistent with acute muscle strain related to coughing.  Lidoderm patch and ibuprofen offered in the ED.  Clinical concern for emergent underlying etiology that warrant further ED workup or inpatient management is exceedingly low.  Return precautions given.  Recommend follow-up with PCP.  Deray voiced understanding of his medical evaluation and treatment plan. Each of their questions answered to their expressed satisfaction.  Return precautions were given.  Patient is well-appearing, stable, and was discharged in good condition.  This chart was dictated using voice recognition software, Dragon. Despite the best efforts of this provider to proofread and correct errors, errors may still occur which can change documentation meaning. Final Clinical Impression(s) / ED Diagnoses Final diagnoses:  None    Rx / DC Orders ED Discharge Orders     None         Sherrilee Gilles 08/21/22 0105    Dione Booze, MD 08/21/22 [phone]

## 2022-08-21 NOTE — Discharge Instructions (Signed)
You were seen in the emergency department today for your right rib pain.  Your physical exam and vital signs are very reassuring.  The muscles in your right chest wall are in what is called spasm, meaning they are inappropriately tightened up.  This can be quite painful.  To help with your pain you may take Tylenol and / or NSAID medication (such as ibuprofen or naproxen) to help with your pain.  Additionally, you have been prescribed a muscle relaxer called Robaxin to help relieve some of the muscle spasm.  Please be advised that this medication may make you very sleepy, so you should not drive or operate heavy machinery while you are taking it.  You may also utilize topical pain relief such as Biofreeze, IcyHot, or topical lidocaine patches.  I also recommend that you apply heat to the area, such as a hot shower or heating pad, and follow heat application with massage of the muscles that are most tight.  Please return to the emergency department if you develop any numbness/tingling/weakness in your arms or legs, any difficulty urinating, or urinary incontinence chest pain, shortness of breath, abdominal pain, nausea or vomiting that does not stop, or any other new severe symptoms.

## 2022-08-22 ENCOUNTER — Encounter: Payer: Self-pay | Admitting: Physician Assistant

## 2022-08-22 ENCOUNTER — Ambulatory Visit
Admission: EM | Admit: 2022-08-22 | Discharge: 2022-08-22 | Disposition: A | Discharge status: Home / Self Care | Payer: 59 | Attending: Physician Assistant | Admitting: Physician Assistant

## 2022-08-22 DIAGNOSIS — R0789 Other chest pain: Secondary | ICD-10-CM

## 2022-08-22 MED ORDER — IBUPROFEN 800 MG PO TABS: 800.0000 mg | ORAL_TABLET | Freq: Three times a day (TID) | ORAL | 0 refills | Status: DC

## 2022-08-22 MED ORDER — KETOROLAC TROMETHAMINE 30 MG/ML IJ SOLN
30.0000 mg | Freq: Once | INTRAMUSCULAR | Status: AC
  Administered 2022-08-22: 30 mg via INTRAMUSCULAR

## 2022-08-22 NOTE — ED Provider Notes (Signed)
[name]    CSN: [id number] Arrival date & time: 08/22/22  1330      History   Chief Complaint Chief Complaint  Patient presents with   Muscle Pain    HPI Ricardo [name] is a 41 y.o. male.   Patient here today for evaluation of continued right sided chest pain.  He reports that he was evaluated in the emergency room yesterday and provided with muscle relaxers but states this has not been effective in relieving pain.  He states that symptoms started after he was coughing and then felt as if something "exploded" on his right side.  He states that he has continued to have progressively worsening tightening in the area.  He notes that movement and deep breathing seem to worsen symptoms.  The history is provided by the patient.    Past Medical History:  Diagnosis Date   Facial pain    able to eat and swallow.06/05/2021   Hypertension    pt has run out of high blood pressure and is not taking anything for his hypertension.  needs to schedule an appointment with dr. 06/05/2021   Neck pain    able to move his neck with out any problems. 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   Irritable bowel syndrome 07/09/2012    Past Surgical History:  Procedure Laterality Date   boil     LASER ABLATION CONDOLAMATA N/A 06/08/2021   Procedure: LASER ABLATION CONDOLAMATA;  Surgeon: Romie Levee, MD;  Location: [facility];  Service: General;  Laterality: N/A;   no past surgery     RECTAL EXAM UNDER ANESTHESIA N/A 06/08/2021   Procedure: RECTAL EXAM UNDER ANESTHESIA;  Surgeon: Romie Levee, MD;  Location: [facility];  Service: General;  Laterality: N/A;   SKIN LESION EXCISION     30 yrs ago had boil removed from his butt. 06/05/2021       Home Medications    Prior to  Admission medications   Medication Sig Start Date End Date Taking? Authorizing Provider  ibuprofen (ADVIL) 800 MG tablet Take 1 tablet (800 mg total) by mouth 3 (three) times daily. 08/22/22  Yes Tomi Bamberger, PA-C  amLODipine (NORVASC) 5 MG tablet Take 5 mg by mouth daily. Patient not taking: Reported on 01/24/2022    [provider]  atorvastatin (LIPITOR) 10 MG tablet Take 10 mg by mouth daily. Patient not taking: Reported on 01/24/2022 07/27/21   [provider]  cephALEXin (KEFLEX) 250 MG capsule Take 250 mg by mouth as needed. Patient not taking: Reported on 01/24/2022 07/27/21   [provider]  gabapentin (NEURONTIN) 300 MG capsule gabapentin 300 mg capsule Patient not taking: Reported on 01/24/2022    [provider]  lidocaine (LIDODERM) 5 % Place 1 patch onto the skin daily. Remove & Discard patch within 12 hours or as directed by MD 08/21/22   Sponseller, Eugene Gavia, PA-C  meloxicam (MOBIC) 7.5 MG tablet meloxicam 7.5 mg tablet Patient not taking: Reported on 01/24/2022    [provider]  methocarbamol (ROBAXIN) 500 MG tablet Take 1 tablet (500 mg total) by mouth 2 (two) times daily as needed for muscle spasms. 08/21/22   Sponseller, Lupe Carney R, PA-C  methylPREDNISolone (MEDROL DOSEPAK) 4 MG TBPK  tablet Take as directed on package. Patient not taking: Reported on 01/24/2022 10/31/21   Valeria Batman, MD  predniSONE (STERAPRED UNI-PAK 21 TAB) 5 MG (21) TBPK tablet Take 5 mg by mouth See admin instructions. follow package directions Patient not taking: Reported on 01/24/2022 08/02/21   [provider]    Family History Family History  Problem Relation Age of Onset   Irritable bowel syndrome Sister    Diabetes Sister    Hyperlipidemia Sister    Hypertension Sister    Hypertension Mother    Hypertension Father    Hyperlipidemia Father     Social History Social History   Tobacco Use   Smoking status: Some Days    Types: Cigars    Smokeless tobacco: Never  Vaping Use   Vaping Use: Never used  Substance Use Topics   Alcohol use: Yes    Alcohol/week: 7.0 standard drinks of alcohol    Types: 3 Cans of beer, 4 Shots of liquor per week   Drug use: No     Allergies   Latex   Review of Systems Review of Systems  Constitutional:  Negative for chills and fever.  Eyes:  Negative for discharge and redness.  Respiratory:  Positive for cough. Negative for shortness of breath.   Cardiovascular:  Positive for chest pain.  Gastrointestinal:  Negative for diarrhea, nausea and vomiting.  Neurological:  Negative for numbness.     Physical Exam Triage Vital Signs ED Triage Vitals  Enc Vitals Group     BP      Pulse      Resp      Temp      Temp src      SpO2      Weight      Height      Head Circumference      Peak Flow      Pain Score      Pain Loc      Pain Edu?      Excl. in GC?    No data found.  Updated Vital Signs BP (!) 138/96 (BP Location: Left Arm)   Pulse 86   Temp 98.1 F (36.7 C) (Oral)   Resp 17   SpO2 97%      Physical Exam Vitals and nursing note reviewed.  Constitutional:      General: He is not in acute distress.    Appearance: Normal appearance. He is not ill-appearing.  HENT:     Head: Normocephalic and atraumatic.  Eyes:     Conjunctiva/sclera: Conjunctivae normal.  Cardiovascular:     Rate and Rhythm: Normal rate and regular rhythm.     Heart sounds: Normal heart sounds. No murmur heard. Pulmonary:     Effort: Pulmonary effort is normal. No respiratory distress.     Breath sounds: Normal breath sounds. No wheezing, rhonchi or rales.  Chest:     Chest wall: Tenderness (right lateral/posterior lower ribs) present.  Neurological:     Mental Status: He is alert.  Psychiatric:        Mood and Affect: Mood normal.        Behavior: Behavior normal.        Thought Content: Thought content normal.      UC Treatments / Results  Labs (all labs ordered are listed, but  only abnormal results are displayed) Labs Reviewed - No data to display  EKG   Radiology DG Ribs Unilateral W/Chest Right  Result Date: 08/21/2022  CLINICAL DATA:  Sharp rib pain on lower right side after cough EXAM: RIGHT RIBS AND CHEST - 3+ VIEW COMPARISON:  Radiographs 11/10/2021 FINDINGS: The area of symptomatic concern as indicated by the patient was denoted with a metallic skin BB by the technologist. No fracture or other bone lesions are seen involving the ribs. There is no evidence of pneumothorax or pleural effusion. Both lungs are clear. Heart size and mediastinal contours are within normal limits. IMPRESSION: No displaced rib fractures. Electronically Signed   By: Minerva Fester M.D.   On: 08/21/2022 00:51    Procedures Procedures (including critical care time)  Medications Ordered in UC Medications  ketorolac (TORADOL) 30 MG/ML injection 30 mg (30 mg Intramuscular Given 08/22/22 1538)    Initial Impression / Assessment and Plan / UC Course  I have reviewed the triage vital signs and the nursing notes.  Pertinent labs & imaging results that were available during my care of the patient were reviewed by me and considered in my medical decision making (see chart for details).    Xray from yesterday without displaced rib fracture. Will trial Toradol injection in office and high dose ibuprofen to start orally tomorrow. Encouraged follow up if no gradual improvement or with any further concerns.   Final Clinical Impressions(s) / UC Diagnoses   Final diagnoses:  Right-sided chest wall pain   Discharge Instructions   None    ED Prescriptions     Medication Sig Dispense Auth. Provider   ibuprofen (ADVIL) 800 MG tablet Take 1 tablet (800 mg total) by mouth 3 (three) times daily. 21 tablet Tomi Bamberger, PA-C      PDMP not reviewed this encounter.   Tomi Bamberger, PA-C 08/22/22 1546

## 2022-08-22 NOTE — ED Triage Notes (Signed)
Pt presents for muscle relaxer for ongoing right side rib pain; pt states he was evaluated in ED yesterday.

## 2022-08-28 DIAGNOSIS — Z6834 Body mass index (BMI) 34.0-34.9, adult: Secondary | ICD-10-CM | POA: Diagnosis not present

## 2022-08-28 DIAGNOSIS — R531 Weakness: Secondary | ICD-10-CM | POA: Diagnosis not present

## 2022-08-28 DIAGNOSIS — R229 Localized swelling, mass and lump, unspecified: Secondary | ICD-10-CM | POA: Diagnosis not present

## 2022-08-28 DIAGNOSIS — E669 Obesity, unspecified: Secondary | ICD-10-CM | POA: Diagnosis not present

## 2022-08-28 DIAGNOSIS — M791 Myalgia, unspecified site: Secondary | ICD-10-CM | POA: Diagnosis not present

## 2022-08-28 DIAGNOSIS — M2540 Effusion, unspecified joint: Secondary | ICD-10-CM | POA: Diagnosis not present

## 2022-09-07 DIAGNOSIS — Z0000 Encounter for general adult medical examination without abnormal findings: Secondary | ICD-10-CM | POA: Diagnosis not present

## 2022-09-07 DIAGNOSIS — E7800 Pure hypercholesterolemia, unspecified: Secondary | ICD-10-CM | POA: Diagnosis not present

## 2022-09-11 DIAGNOSIS — R591 Generalized enlarged lymph nodes: Secondary | ICD-10-CM | POA: Diagnosis not present

## 2022-09-19 DIAGNOSIS — K1120 Sialoadenitis, unspecified: Secondary | ICD-10-CM | POA: Diagnosis not present

## 2022-09-20 ENCOUNTER — Other Ambulatory Visit: Payer: Self-pay | Admitting: Internal Medicine

## 2022-09-20 DIAGNOSIS — K1120 Sialoadenitis, unspecified: Secondary | ICD-10-CM

## 2022-10-10 DIAGNOSIS — R69 Illness, unspecified: Secondary | ICD-10-CM | POA: Diagnosis not present

## 2022-10-10 DIAGNOSIS — E669 Obesity, unspecified: Secondary | ICD-10-CM | POA: Diagnosis not present

## 2022-10-10 DIAGNOSIS — M791 Myalgia, unspecified site: Secondary | ICD-10-CM | POA: Diagnosis not present

## 2022-10-10 DIAGNOSIS — R531 Weakness: Secondary | ICD-10-CM | POA: Diagnosis not present

## 2022-10-10 DIAGNOSIS — M2540 Effusion, unspecified joint: Secondary | ICD-10-CM | POA: Diagnosis not present

## 2022-10-10 DIAGNOSIS — Z6834 Body mass index (BMI) 34.0-34.9, adult: Secondary | ICD-10-CM | POA: Diagnosis not present

## 2022-10-10 DIAGNOSIS — R229 Localized swelling, mass and lump, unspecified: Secondary | ICD-10-CM | POA: Diagnosis not present

## 2022-10-25 ENCOUNTER — Other Ambulatory Visit: Payer: 59

## 2022-11-08 DIAGNOSIS — K115 Sialolithiasis: Secondary | ICD-10-CM | POA: Diagnosis not present

## 2022-11-15 ENCOUNTER — Other Ambulatory Visit: Payer: 59

## 2022-11-15 DIAGNOSIS — K6289 Other specified diseases of anus and rectum: Secondary | ICD-10-CM | POA: Diagnosis not present

## 2022-11-15 DIAGNOSIS — K641 Second degree hemorrhoids: Secondary | ICD-10-CM | POA: Diagnosis not present

## 2022-11-15 DIAGNOSIS — A630 Anogenital (venereal) warts: Secondary | ICD-10-CM | POA: Diagnosis not present

## 2022-11-27 ENCOUNTER — Encounter: Payer: Self-pay | Admitting: Internal Medicine

## 2022-11-28 ENCOUNTER — Encounter: Payer: Self-pay | Admitting: Internal Medicine

## 2022-11-30 ENCOUNTER — Ambulatory Visit
Admission: RE | Admit: 2022-11-30 | Discharge: 2022-11-30 | Disposition: A | Discharge status: Home / Self Care | Payer: 59 | Source: Ambulatory Visit | Attending: Internal Medicine | Admitting: Internal Medicine

## 2022-11-30 DIAGNOSIS — K115 Sialolithiasis: Secondary | ICD-10-CM | POA: Diagnosis not present

## 2022-11-30 DIAGNOSIS — I6522 Occlusion and stenosis of left carotid artery: Secondary | ICD-10-CM | POA: Diagnosis not present

## 2022-11-30 DIAGNOSIS — K1120 Sialoadenitis, unspecified: Secondary | ICD-10-CM

## 2022-11-30 DIAGNOSIS — J439 Emphysema, unspecified: Secondary | ICD-10-CM | POA: Diagnosis not present

## 2022-11-30 MED ORDER — IOPAMIDOL (ISOVUE-300) INJECTION 61%
75.0000 mL | Freq: Once | INTRAVENOUS | Status: AC | PRN
  Administered 2022-11-30: 75 mL via INTRAVENOUS

## 2022-12-04 DIAGNOSIS — M542 Cervicalgia: Secondary | ICD-10-CM | POA: Diagnosis not present

## 2022-12-04 DIAGNOSIS — M25512 Pain in left shoulder: Secondary | ICD-10-CM | POA: Diagnosis not present

## 2022-12-11 DIAGNOSIS — F17200 Nicotine dependence, unspecified, uncomplicated: Secondary | ICD-10-CM | POA: Diagnosis not present

## 2022-12-11 DIAGNOSIS — K115 Sialolithiasis: Secondary | ICD-10-CM | POA: Diagnosis not present

## 2022-12-11 DIAGNOSIS — Z789 Other specified health status: Secondary | ICD-10-CM | POA: Insufficient documentation

## 2022-12-11 DIAGNOSIS — K1321 Leukoplakia of oral mucosa, including tongue: Secondary | ICD-10-CM | POA: Diagnosis not present

## 2022-12-31 NOTE — Pre-Procedure Instructions (Signed)
Surgical Instructions    Your procedure is scheduled on Monday 01/07/23.   Report to [facility] Main Entrance "A" at 11:00 A.M., then check in with the Admitting office.  Call this number if you have problems the morning of surgery:  [phone]   If you have any questions prior to your surgery date call [phone]: Open Monday-Friday 8am-4pm If you experience any cold or flu symptoms such as cough, fever, chills, shortness of breath, etc. between now and your scheduled surgery, please notify us at the above number     Remember:  Do not eat after midnight the night before your surgery  You may drink clear liquids until 10:00 A.M. the morning of your surgery.   Clear liquids allowed are: Water, Non-Citrus Juices (without pulp), Carbonated Beverages, Clear Tea, Black Coffee ONLY (NO MILK, CREAM OR POWDERED CREAMER of any kind), and Gatorade    Take these medicines the morning of surgery with A SIP OF WATER:   amLODipine (NORVASC)   As of today, STOP taking any Aspirin (unless otherwise instructed by your surgeon) Aleve, Naproxen, Ibuprofen, Motrin, Advil, Goody's, BC's, all herbal medications, fish oil, and all vitamins.           Do not wear jewelry or makeup. Do not wear lotions, powders, perfumes/cologne or deodorant. Do not shave 48 hours prior to surgery.  Men may shave face and neck. Do not bring valuables to the hospital. Do not wear nail polish, gel polish, artificial nails, or any other type of covering on natural nails (fingers and toes) If you have artificial nails or gel coating that need to be removed by a nail salon, please have this removed prior to surgery. Artificial nails or gel coating may interfere with anesthesia's ability to adequately monitor your vital signs.  [facility] is not responsible for any belongings or valuables.    Do NOT Smoke (Tobacco/Vaping)  24 hours prior to your procedure  If you use a CPAP at night, you may bring your mask for your  overnight stay.   Contacts, glasses, hearing aids, dentures or partials may not be worn into surgery, please bring cases for these belongings   For patients admitted to the hospital, discharge time will be determined by your treatment team.   Patients discharged the day of surgery will not be allowed to drive home, and someone needs to stay with them for 24 hours.   SURGICAL WAITING ROOM VISITATION Patients having surgery or a procedure may have no more than 2 support people in the waiting area - these visitors may rotate.   Children under the age of 9 must have an adult with them who is not the patient. If the patient needs to stay at the hospital during part of their recovery, the visitor guidelines for inpatient rooms apply. Pre-op nurse will coordinate an appropriate time for 1 support person to accompany patient in pre-op.  This support person may not rotate.   Please refer to [email or web address] for the visitor guidelines for Inpatients (after your surgery is over and you are in a regular room).    Special instructions:    Oral Hygiene is also important to reduce your risk of infection.  Remember - BRUSH YOUR TEETH THE MORNING OF SURGERY WITH YOUR REGULAR TOOTHPASTE   [facility]- Preparing For Surgery  Before surgery, you can play an important role. Because skin is not sterile, your skin needs to be as free of germs as possible. You can reduce the number of  germs on your skin by washing with CHG (chlorahexidine gluconate) Soap before surgery.  CHG is an antiseptic cleaner which kills germs and bonds with the skin to continue killing germs even after washing.     Please do not use if you have an allergy to CHG or antibacterial soaps. If your skin becomes reddened/irritated stop using the CHG.  Do not shave (including legs and underarms) for at least 48 hours prior to first CHG shower. It is OK to shave your face.  Please follow  these instructions carefully.     Shower the NIGHT BEFORE SURGERY and the MORNING OF SURGERY with CHG Soap.   If you chose to wash your hair, wash your hair first as usual with your normal shampoo. After you shampoo, rinse your hair and body thoroughly to remove the shampoo.  Then [employer] and genitals (private parts) with your normal soap and rinse thoroughly to remove soap.  After that Use CHG Soap as you would any other liquid soap. You can apply CHG directly to the skin and wash gently with a scrungie or a clean washcloth.   Apply the CHG Soap to your body ONLY FROM THE NECK DOWN.  Do not use on open wounds or open sores. Avoid contact with your eyes, ears, mouth and genitals (private parts). Wash Face and genitals (private parts)  with your normal soap.   Wash thoroughly, paying special attention to the area where your surgery will be performed.  Thoroughly rinse your body with warm water from the neck down.  DO NOT shower/wash with your normal soap after using and rinsing off the CHG Soap.  Pat yourself dry with a CLEAN TOWEL.  Wear CLEAN PAJAMAS to bed the night before surgery  Place CLEAN SHEETS on your bed the night before your surgery  DO NOT SLEEP WITH PETS.   Day of Surgery:  Take a shower with CHG soap. Wear Clean/Comfortable clothing the morning of surgery Do not apply any deodorants/lotions.   Remember to brush your teeth WITH YOUR REGULAR TOOTHPASTE.    If you received a COVID test during your pre-op visit, it is requested that you wear a mask when out in public, stay away from anyone that may not be feeling well, and notify your surgeon if you develop symptoms. If you have been in contact with anyone that has tested positive in the last 10 days, please notify your surgeon.    Please read over the following fact sheets that you were given.

## 2023-01-01 ENCOUNTER — Other Ambulatory Visit: Payer: Self-pay

## 2023-01-01 ENCOUNTER — Encounter (HOSPITAL_COMMUNITY): Payer: Self-pay

## 2023-01-01 ENCOUNTER — Encounter (HOSPITAL_COMMUNITY)
Admission: RE | Admit: 2023-01-01 | Discharge: 2023-01-01 | Disposition: A | Discharge status: Home / Self Care | Payer: 59 | Source: Ambulatory Visit | Attending: Otolaryngology | Admitting: Otolaryngology

## 2023-01-01 VITALS — BP 144/95 | HR 97 | Temp 98.3°F | Resp 18 | Ht 67.0 in | Wt 214.4 lb

## 2023-01-01 DIAGNOSIS — Z01818 Encounter for other preprocedural examination: Secondary | ICD-10-CM

## 2023-01-01 DIAGNOSIS — I2510 Atherosclerotic heart disease of native coronary artery without angina pectoris: Secondary | ICD-10-CM | POA: Insufficient documentation

## 2023-01-01 HISTORY — DX: Gastro-esophageal reflux disease without esophagitis: K21.9

## 2023-01-01 LAB — CBC
HCT: 42.4 % (ref 39.0–52.0)
Hemoglobin: 14.9 g/dL (ref 13.0–17.0)
MCH: 33 pg (ref 26.0–34.0)
MCHC: 35.1 g/dL (ref 30.0–36.0)
MCV: 94 fL (ref 80.0–100.0)
Platelets: 285 10*3/uL (ref 150–400)
RBC: 4.51 MIL/uL (ref 4.22–5.81)
RDW: 14.3 % (ref 11.5–15.5)
WBC: 7.6 10*3/uL (ref 4.0–10.5)
nRBC: 0 % (ref 0.0–0.2)

## 2023-01-01 LAB — COMPREHENSIVE METABOLIC PANEL
ALT: 21 U/L (ref 0–44)
AST: 20 U/L (ref 15–41)
Albumin: 3.7 g/dL (ref 3.5–5.0)
Alkaline Phosphatase: 65 U/L (ref 38–126)
Anion gap: 9 (ref 5–15)
BUN: 8 mg/dL (ref 6–20)
CO2: 26 mmol/L (ref 22–32)
Calcium: 9.1 mg/dL (ref 8.9–10.3)
Chloride: 103 mmol/L (ref 98–111)
Creatinine, Ser: 1.06 mg/dL (ref 0.61–1.24)
GFR, Estimated: >60 mL/min (ref >60)
Glucose, Bld: 83 mg/dL (ref 70–99)
Potassium: 3.6 mmol/L (ref 3.5–5.1)
Sodium: 138 mmol/L (ref 135–145)
Total Bilirubin: 0.5 mg/dL (ref 0.3–1.2)
Total Protein: 7.2 g/dL (ref 6.5–8.1)

## 2023-01-01 NOTE — Progress Notes (Addendum)
PCP - Renford Dills Cardiologist - denies  PPM/ICD - denies  Chest x-ray - n/a EKG - 01/01/23 Stress Test - denies ECHO - denies Cardiac Cath - denies  Sleep Study - denies  ERAS Protcol -yes PRE-SURGERY Ensure or G2- none ordered  COVID TEST- not needed   Anesthesia review: no  Patient denies shortness of breath, fever, cough and chest pain at PAT appointment   All instructions explained to the patient, with a verbal understanding of the material. Patient agrees to go over the instructions while at home for a better understanding. Patient also instructed to self quarantine after being tested for COVID-19. The opportunity to ask questions was provided.

## 2023-01-02 DIAGNOSIS — A630 Anogenital (venereal) warts: Secondary | ICD-10-CM | POA: Diagnosis not present

## 2023-01-02 NOTE — H&P (Signed)
HPI:  Ricardo [name] is a 42 y.o. male who presents as a new Patient.  Referring Provider: Susy Frizzle, MD  Chief complaint: Submandibular gland.  HPI: History of recurrent submandibular swelling with meals since he was 16. He has been using antibiotics and steroids 5-7 times annually. He is ready to get the gland removed. He is a smoker, smokes 3 to 4 cigars daily. He also drinks alcohol on a regular basis.  PMH/Meds/All/SocHx/FamHx/ROS:  Past Medical History: Diagnosis Date Hypercholesterolemia Hypertension controlled with medications  Past Surgical History: Procedure Laterality Date DESTRUCTION OF LESION ANAL 2023 Procedure: ANAL LESION EXAMINATION UNDER ANESTHESIA N/A 11/15/2022 Procedure: EXAM UNDER ANESTHESIA ([facility]) WITH ANOSCOPY AND EXCISION AND FULGURATION OF ANAL LESIONS; Surgeon: Donnetta Hail, MD; Location: [facility]; Service: General; Laterality: N/A; lithotomy  No family history of bleeding disorders, wound healing problems or difficulty with anesthesia.    Current Outpatient Medications: amLODIPine (NORVASC) 5 mg tablet, Take 1 tablet by mouth Once Daily., Disp: , Rfl: atorvastatin (LIPITOR) 10 mg tablet, , Disp: , Rfl: methocarbamoL (ROBAXIN) 500 mg tablet, , Disp: , Rfl: silver sulfADIAZINE (SILVADENE) 1 % cream, Apply topically daily. Three times or as needed, Disp: 50 g, Rfl: 1  A complete ROS was performed with pertinent positives/negatives noted in the HPI. The remainder of the ROS are negative.   Physical Exam:  Pulse (!) 116  Temp 97.1 F (36.2 C) (Temporal)  Resp 18  Ht 1.702 m (5\' 7" )  Wt 98.9 kg (218 lb)  SpO2 98%  BMI 34.14 kg/m  General: Healthy and alert, in no distress, breathing easily. Normal affect. In a pleasant mood. Head: Normocephalic, atraumatic. No masses, or scars. Eyes: Pupils are equal, and reactive to light. Vision is grossly intact. No spontaneous or gaze nystagmus. Ears: Ear canals are clear. Tympanic  membranes are intact, with normal landmarks and the middle ears are clear and healthy. Hearing: Grossly normal. Nose: Nasal cavities are clear with healthy mucosa, no polyps or exudate. Airways are patent. Face: No masses or scars, facial nerve function is symmetric. Oral Cavity: Floor of mouth is soft and free of any palpable masses or stones. There is a 2 cm area of thickened and leukoplakic mucosa in the left posterior buccal/retromolar trigone area. It is not tender to palpation and there is no mass palpable. No other mucosal abnormalities are noted. Tongue with normal mobility. Dentition appears healthy. Oropharynx: Tonsils are symmetric. There are no mucosal masses identified. Tongue base appears normal and healthy. Larynx/Hypopharynx: indirect exam reveals healthy, mobile vocal cords, without mucosal lesions in the hypopharynx or larynx. Chest: Deferred Neck: Right submandibular gland is firm and enlarged, otherwise no palpable masses, no cervical adenopathy, no thyroid nodules or enlargement. Neuro: Cranial nerves II-XII with normal function. Balance: Normal gate. Other findings: none.  Independent Review of Additional Tests or Records: CT neck:  IMPRESSION: 1. Right submandibular sialolithiasis measuring up to 10 mm with associated ductal dilatation. No surrounding inflammation. 2. Medialization of the right vocal fold with dilatation of the ipsilateral piriform sinus, suspicious for right vocal cord paresis. 3. Emphysema (ICD10-J43.9).  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 Recommend biopsy and CO2 laser excision of this under anesthesia.  2. Chronic ongoing submandibular sialolithiasis. Recommend removal of the submandibular gland. Risks and benefits were discussed in detail including the scar, drain, general anesthesia, possibility of lip weakness. All questions were answered. He would like to go ahead and  schedule.

## 2023-01-04 NOTE — Progress Notes (Signed)
Patient was called and informed that the surgery time for Monday was changed to 12:15 o'clock. Patient was instructed to be at the hospital at 10:15 o'clock and stop drinking clear liquids at 09:15 o'clock. Patient verbalized understanding. 

## 2023-01-07 ENCOUNTER — Encounter (HOSPITAL_COMMUNITY)
Admission: RE | Disposition: A | Discharge status: Home / Self Care | Payer: Self-pay | Source: Home / Self Care | Attending: Otolaryngology

## 2023-01-07 ENCOUNTER — Encounter (HOSPITAL_COMMUNITY): Payer: Self-pay | Admitting: Otolaryngology

## 2023-01-07 ENCOUNTER — Ambulatory Visit (HOSPITAL_COMMUNITY)
Admission: RE | Admit: 2023-01-07 | Discharge: 2023-01-07 | Disposition: A | Discharge status: Home / Self Care | Payer: 59 | Attending: Otolaryngology | Admitting: Otolaryngology

## 2023-01-07 ENCOUNTER — Other Ambulatory Visit: Payer: Self-pay

## 2023-01-07 ENCOUNTER — Ambulatory Visit (HOSPITAL_COMMUNITY): Payer: 59 | Admitting: Anesthesiology

## 2023-01-07 ENCOUNTER — Ambulatory Visit (HOSPITAL_BASED_OUTPATIENT_CLINIC_OR_DEPARTMENT_OTHER): Payer: 59 | Admitting: Anesthesiology

## 2023-01-07 DIAGNOSIS — E669 Obesity, unspecified: Secondary | ICD-10-CM | POA: Diagnosis not present

## 2023-01-07 DIAGNOSIS — K115 Sialolithiasis: Secondary | ICD-10-CM | POA: Insufficient documentation

## 2023-01-07 DIAGNOSIS — Z6834 Body mass index (BMI) 34.0-34.9, adult: Secondary | ICD-10-CM | POA: Insufficient documentation

## 2023-01-07 DIAGNOSIS — I10 Essential (primary) hypertension: Secondary | ICD-10-CM | POA: Diagnosis not present

## 2023-01-07 DIAGNOSIS — K219 Gastro-esophageal reflux disease without esophagitis: Secondary | ICD-10-CM | POA: Insufficient documentation

## 2023-01-07 DIAGNOSIS — F17290 Nicotine dependence, other tobacco product, uncomplicated: Secondary | ICD-10-CM | POA: Insufficient documentation

## 2023-01-07 DIAGNOSIS — K1321 Leukoplakia of oral mucosa, including tongue: Secondary | ICD-10-CM | POA: Insufficient documentation

## 2023-01-07 DIAGNOSIS — R221 Localized swelling, mass and lump, neck: Secondary | ICD-10-CM | POA: Diagnosis not present

## 2023-01-07 DIAGNOSIS — K1123 Chronic sialoadenitis: Secondary | ICD-10-CM | POA: Diagnosis not present

## 2023-01-07 HISTORY — PX: EXCISION ORAL LESION WITH CO2 LASER: SHX6461

## 2023-01-07 HISTORY — PX: SUBMANDIBULAR GLAND EXCISION: SHX2456

## 2023-01-07 SURGERY — EXCISION, SUBMANDIBULAR GLAND
Anesthesia: General | Site: Neck | Laterality: Right

## 2023-01-07 MED ORDER — BACITRACIN ZINC 500 UNIT/GM EX OINT
TOPICAL_OINTMENT | CUTANEOUS | Status: AC
  Filled 2023-01-07: qty 28.35

## 2023-01-07 MED ORDER — DEXAMETHASONE SODIUM PHOSPHATE 4 MG/ML IJ SOLN
INTRAMUSCULAR | Status: DC | PRN
  Administered 2023-01-07: 10 mg via INTRAVENOUS

## 2023-01-07 MED ORDER — LIDOCAINE 2% (20 MG/ML) 5 ML SYRINGE
INTRAMUSCULAR | Status: DC | PRN
  Administered 2023-01-07: 100 mg via INTRAVENOUS

## 2023-01-07 MED ORDER — OXYCODONE HCL 5 MG PO TABS
ORAL_TABLET | ORAL | Status: AC
  Filled 2023-01-07: qty 1

## 2023-01-07 MED ORDER — FENTANYL CITRATE (PF) 100 MCG/2ML IJ SOLN: 25.0000 ug | INTRAMUSCULAR | Status: DC | PRN

## 2023-01-07 MED ORDER — MIDAZOLAM HCL 5 MG/5ML IJ SOLN
INTRAMUSCULAR | Status: DC | PRN
  Administered 2023-01-07: 2 mg via INTRAVENOUS

## 2023-01-07 MED ORDER — LACTATED RINGERS IV SOLN: INTRAVENOUS | Status: DC

## 2023-01-07 MED ORDER — GLYCOPYRROLATE 0.2 MG/ML IJ SOLN
INTRAMUSCULAR | Status: DC | PRN
  Administered 2023-01-07: .2 mg via INTRAVENOUS

## 2023-01-07 MED ORDER — LIDOCAINE-EPINEPHRINE 1 %-1:100000 IJ SOLN
INTRAMUSCULAR | Status: AC
  Filled 2023-01-07: qty 1

## 2023-01-07 MED ORDER — ONDANSETRON HCL 4 MG/2ML IJ SOLN
INTRAMUSCULAR | Status: AC
  Filled 2023-01-07: qty 2

## 2023-01-07 MED ORDER — SUGAMMADEX SODIUM 200 MG/2ML IV SOLN
INTRAVENOUS | Status: DC | PRN
  Administered 2023-01-07: 100 mg via INTRAVENOUS
  Administered 2023-01-07: 200 mg via INTRAVENOUS

## 2023-01-07 MED ORDER — CHLORHEXIDINE GLUCONATE 0.12 % MT SOLN
15.0000 mL | Freq: Once | OROMUCOSAL | Status: AC
  Administered 2023-01-07: 15 mL via OROMUCOSAL
  Filled 2023-01-07: qty 15

## 2023-01-07 MED ORDER — OXYCODONE HCL 5 MG PO TABS
5.0000 mg | ORAL_TABLET | Freq: Once | ORAL | Status: AC | PRN
  Administered 2023-01-07: 5 mg via ORAL

## 2023-01-07 MED ORDER — 0.9 % SODIUM CHLORIDE (POUR BTL) OPTIME
TOPICAL | Status: DC | PRN
  Administered 2023-01-07: 1000 mL

## 2023-01-07 MED ORDER — CEFAZOLIN SODIUM-DEXTROSE 2-3 GM-%(50ML) IV SOLR
INTRAVENOUS | Status: DC | PRN
  Administered 2023-01-07: 2 g via INTRAVENOUS

## 2023-01-07 MED ORDER — DEXAMETHASONE SODIUM PHOSPHATE 10 MG/ML IJ SOLN
INTRAMUSCULAR | Status: AC
  Filled 2023-01-07: qty 1

## 2023-01-07 MED ORDER — LIDOCAINE-EPINEPHRINE 1 %-1:100000 IJ SOLN
INTRAMUSCULAR | Status: DC | PRN
  Administered 2023-01-07: 2 mL

## 2023-01-07 MED ORDER — PROMETHAZINE HCL 25 MG/ML IJ SOLN: 6.2500 mg | INTRAMUSCULAR | Status: DC | PRN

## 2023-01-07 MED ORDER — ONDANSETRON 4 MG PO TBDP: 4.0000 mg | ORAL_TABLET | Freq: Three times a day (TID) | ORAL | 0 refills | Status: DC | PRN

## 2023-01-07 MED ORDER — ROCURONIUM BROMIDE 10 MG/ML (PF) SYRINGE
PREFILLED_SYRINGE | INTRAVENOUS | Status: AC
  Filled 2023-01-07: qty 10

## 2023-01-07 MED ORDER — PROPOFOL 10 MG/ML IV BOLUS
INTRAVENOUS | Status: DC | PRN
  Administered 2023-01-07: 200 mg via INTRAVENOUS
  Administered 2023-01-07 (×2): 50 mg via INTRAVENOUS

## 2023-01-07 MED ORDER — LIDOCAINE 2% (20 MG/ML) 5 ML SYRINGE
INTRAMUSCULAR | Status: AC
  Filled 2023-01-07: qty 5

## 2023-01-07 MED ORDER — FENTANYL CITRATE (PF) 250 MCG/5ML IJ SOLN
INTRAMUSCULAR | Status: AC
  Filled 2023-01-07: qty 5

## 2023-01-07 MED ORDER — ONDANSETRON HCL 4 MG/2ML IJ SOLN
INTRAMUSCULAR | Status: DC | PRN
  Administered 2023-01-07: 4 mg via INTRAVENOUS

## 2023-01-07 MED ORDER — DEXMEDETOMIDINE HCL IN NACL 80 MCG/20ML IV SOLN
INTRAVENOUS | Status: AC
  Filled 2023-01-07: qty 20

## 2023-01-07 MED ORDER — ORAL CARE MOUTH RINSE: 15.0000 mL | Freq: Once | OROMUCOSAL | Status: AC

## 2023-01-07 MED ORDER — MIDAZOLAM HCL 2 MG/2ML IJ SOLN
INTRAMUSCULAR | Status: AC
  Filled 2023-01-07: qty 2

## 2023-01-07 MED ORDER — ESMOLOL HCL 100 MG/10ML IV SOLN
INTRAVENOUS | Status: DC | PRN
  Administered 2023-01-07 (×2): 10 mg via INTRAVENOUS
  Administered 2023-01-07 (×2): 20 mg via INTRAVENOUS

## 2023-01-07 MED ORDER — FENTANYL CITRATE (PF) 100 MCG/2ML IJ SOLN
INTRAMUSCULAR | Status: DC | PRN
  Administered 2023-01-07 (×2): 100 ug via INTRAVENOUS
  Administered 2023-01-07: 50 ug via INTRAVENOUS

## 2023-01-07 MED ORDER — ESMOLOL HCL 100 MG/10ML IV SOLN
INTRAVENOUS | Status: AC
  Filled 2023-01-07: qty 10

## 2023-01-07 MED ORDER — OXYCODONE HCL 5 MG/5ML PO SOLN: 5.0000 mg | Freq: Once | ORAL | Status: AC | PRN

## 2023-01-07 MED ORDER — DEXMEDETOMIDINE HCL IN NACL 80 MCG/20ML IV SOLN
INTRAVENOUS | Status: DC | PRN
  Administered 2023-01-07 (×2): 8 ug via BUCCAL
  Administered 2023-01-07: 12 ug via BUCCAL

## 2023-01-07 MED ORDER — HYDROCODONE-ACETAMINOPHEN 7.5-325 MG PO TABS: 1.0000 | ORAL_TABLET | Freq: Four times a day (QID) | ORAL | 0 refills | Status: DC | PRN

## 2023-01-07 MED ORDER — PROPOFOL 10 MG/ML IV BOLUS
INTRAVENOUS | Status: AC
  Filled 2023-01-07: qty 20

## 2023-01-07 MED ORDER — ACETAMINOPHEN 500 MG PO TABS
1000.0000 mg | ORAL_TABLET | Freq: Once | ORAL | Status: AC
  Administered 2023-01-07: 1000 mg via ORAL
  Filled 2023-01-07: qty 2

## 2023-01-07 MED ORDER — ROCURONIUM BROMIDE 10 MG/ML (PF) SYRINGE
PREFILLED_SYRINGE | INTRAVENOUS | Status: DC | PRN
  Administered 2023-01-07: 20 mg via INTRAVENOUS
  Administered 2023-01-07: 50 mg via INTRAVENOUS
  Administered 2023-01-07: 20 mg via INTRAVENOUS

## 2023-01-07 MED ORDER — LIDOCAINE-EPINEPHRINE (PF) 1 %-1:200000 IJ SOLN
INTRAMUSCULAR | Status: AC
  Filled 2023-01-07: qty 30

## 2023-01-07 MED ORDER — EPINEPHRINE HCL (NASAL) 0.1 % NA SOLN
NASAL | Status: AC
  Filled 2023-01-07: qty 90

## 2023-01-07 SURGICAL SUPPLY — 57 items
ADH SKN CLS APL DERMABOND .7 (GAUZE/BANDAGES/DRESSINGS) ×2
CANISTER SUCT 3000ML PPV (MISCELLANEOUS) ×2 IMPLANT
CLEANER TIP ELECTROSURG 2X2 (MISCELLANEOUS) ×2 IMPLANT
CNTNR URN SCR LID CUP LEK RST (MISCELLANEOUS) ×2 IMPLANT
CONT SPEC 4OZ STRL OR WHT (MISCELLANEOUS) ×2
CORD BIPOLAR FORCEPS 12FT (ELECTRODE) ×2 IMPLANT
COVER BACK TABLE 60X90IN (DRAPES) ×2 IMPLANT
COVER SURGICAL LIGHT HANDLE (MISCELLANEOUS) ×2 IMPLANT
DEPRESSOR TONGUE 6 IN STERILE (GAUZE/BANDAGES/DRESSINGS) ×2 IMPLANT
DERMABOND ADVANCED .7 DNX12 (GAUZE/BANDAGES/DRESSINGS) ×2 IMPLANT
DRAIN JP 10F RND RADIO (DRAIN) IMPLANT
DRAIN PENROSE 12X.25 LTX STRL (MISCELLANEOUS) ×2 IMPLANT
DRAPE HALF SHEET 40X57 (DRAPES) ×2 IMPLANT
DRAPE SURG 17X23 STRL (DRAPES) ×2 IMPLANT
ELECT COATED BLADE 2.86 ST (ELECTRODE) ×2 IMPLANT
ELECT REM PT RETURN 9FT ADLT (ELECTROSURGICAL) ×2
ELECTRODE REM PT RTRN 9FT ADLT (ELECTROSURGICAL) ×2 IMPLANT
EVACUATOR SILICONE 100CC (DRAIN) IMPLANT
FORCEPS BIPOLAR SPETZLER 8 1.0 (NEUROSURGERY SUPPLIES) ×2 IMPLANT
GAUZE 4X4 16PLY ~~LOC~~+RFID DBL (SPONGE) IMPLANT
GAUZE SPONGE 4X4 12PLY STRL (GAUZE/BANDAGES/DRESSINGS) IMPLANT
GLOVE ECLIPSE 7.5 STRL STRAW (GLOVE) ×2 IMPLANT
GOWN STRL REUS W/ TWL LRG LVL3 (GOWN DISPOSABLE) ×4 IMPLANT
GOWN STRL REUS W/TWL LRG LVL3 (GOWN DISPOSABLE) ×4
GUARD TEETH (MISCELLANEOUS) IMPLANT
KIT BASIN OR (CUSTOM PROCEDURE TRAY) ×2 IMPLANT
KIT TURNOVER KIT B (KITS) ×2 IMPLANT
LOCATOR NERVE 3 VOLT (DISPOSABLE) IMPLANT
NDL PRECISIONGLIDE 27X1.5 (NEEDLE) ×2 IMPLANT
NEEDLE PRECISIONGLIDE 27X1.5 (NEEDLE) ×2 IMPLANT
NS IRRIG 1000ML POUR BTL (IV SOLUTION) ×2 IMPLANT
PAD ARMBOARD 7.5X6 YLW CONV (MISCELLANEOUS) ×4 IMPLANT
PENCIL FOOT CONTROL (ELECTRODE) ×2 IMPLANT
PUNCH BIOPSY 4MM (MISCELLANEOUS) ×2
PUNCH BIOPSY DERMAL 6MM STRL (MISCELLANEOUS) IMPLANT
PUNCH BIOPSY DISP 4 (MISCELLANEOUS) IMPLANT
SHEARS HARMONIC 9CM CVD (BLADE) IMPLANT
SPECIMEN JAR SMALL (MISCELLANEOUS) ×2 IMPLANT
STAPLER VISISTAT 35W (STAPLE) ×2 IMPLANT
SUT CHROMIC 3 0 PS 2 (SUTURE) IMPLANT
SUT CHROMIC 4 0 PS 2 18 (SUTURE) IMPLANT
SUT ETHILON 2 0 FS 18 (SUTURE) IMPLANT
SUT ETHILON 3 0 PS 1 (SUTURE) IMPLANT
SUT NYLON ETHILON 5-0 P-3 1X18 (SUTURE) IMPLANT
SUT SILK 2 0 REEL (SUTURE) IMPLANT
SUT SILK 2 0 SH CR/8 (SUTURE) IMPLANT
SUT SILK 3 0 REEL (SUTURE) ×2 IMPLANT
SUT SILK 4 0 REEL (SUTURE) ×2 IMPLANT
SUT VIC AB 3-0 FS2 27 (SUTURE) IMPLANT
SUT VICRYL 4-0 PS2 18IN ABS (SUTURE) IMPLANT
SYR CONTROL 10ML LL (SYRINGE) ×2 IMPLANT
SYR TB 1ML LUER SLIP (SYRINGE) IMPLANT
TOWEL GREEN STERILE FF (TOWEL DISPOSABLE) ×2 IMPLANT
TRAY ENT MC OR (CUSTOM PROCEDURE TRAY) ×2 IMPLANT
TUBE CONNECTING 12X1/4 (SUCTIONS) ×2 IMPLANT
WATER STERILE IRR 1000ML POUR (IV SOLUTION) ×2 IMPLANT
YANKAUER SUCT BULB TIP NO VENT (SUCTIONS) ×2 IMPLANT

## 2023-01-07 NOTE — Op Note (Signed)
OPERATIVE REPORT  DATE OF SURGERY: 01/07/2023  PATIENT:  Ricardo [name],  42 y.o. male  PRE-OPERATIVE DIAGNOSIS:  Sialolithiasis of submandibular gland; Neck mass; Oral leukoplakia  POST-OPERATIVE DIAGNOSIS:  Sialolithiasis of submandibular gland; Neck mass; Oral leukoplakia  PROCEDURE:  Procedure(s): EXCISION SUBMANDIBULAR GLAND, Right ORAL CAVITY BIOPSY WITH CO2 LASER EXCISION OF LEUKOPLAKIA  SURGEON:  Susy Frizzle, MD  ASSISTANTS: RNFA  ANESTHESIA:   General   EBL: 80 ml  DRAINS: 10 French round JP  LOCAL MEDICATIONS USED: 1% Xylocaine with epinephrine in the oral procedure  SPECIMEN: 1.  Right submandibular gland with associated lymph node and calculus 2.  Left posterior buccal mucosal biopsy.  COUNTS:  Correct  PROCEDURE DETAILS: The patient was taken to the operating room and placed on the operating table in the supine position. Following induction of general endotracheal anesthesia, the right neck intervention.  The beard was shaved to expose the cervical skin crease that would be used with the incision.  1.  Right submandibular gland removal.  Skin crease was outlined with a marking pen approximately 2 fingerbreadths below the mandible.  Electrocautery was used to incise the skin and subcutaneous tissue and through the platysma layer.  A subplatysmal flap was developed superiorly up towards the mandible.  Fibrofatty tissue superficial to the gland that was presumed to contain the marginal mandibular branch of the facial nerve was dissected off of the gland and reflected superiorly to preserve the nerve.  Facial vessels were separately identified ligated between clamps and divided.  4-0 silk ties were used throughout the case.  Soft tissue was then dissected off of the mandible exposing the gland and the mylohyoid muscle.  There was extensive scar tissue surrounding the mylohyoid muscle and the gland.  The mylohyoid was reflected up superiorly exposing the submandibular  ganglion and the lingual nerve.  The ganglion was sacrificed and the nerve was preserved.  The duct was then identified with a large calculus and the duct was ligated just distal to the calculus.  The gland was then dissected free from the surrounding digastric muscle.  Hypoglossal nerve was preserved.  Lingual artery was then ligated between clamps and divided.  The gland was delivered.  The calculus was palpable and was removed with a scalpel to inspect.  There were no other masses identified.  The wound was irrigated with saline.  The drain was exited through a separate stab incision inferiorly and secured in place with a nylon suture.  The platysma layer was reapproximated with interrupted 3-0 chromic.  A subcuticular 3-0 chromic running closure was then accomplished and Dermabond was used on the skin.  The drain was charged.  2.  The oral cavity was then inspected.  A bite block was used for this portion of the procedure.  The left posterior buccal mucosa contained very thick area of leukoplakia approximately 2 and half centimeters by 1 cm in dimension.  This area was infiltrated with Xylocaine with epinephrine.  A 4 mm dermal punch was then used to biopsy the thickest portion this was sent for pathologic evaluation.  The carbon oxide laser with a handpiece was then used on the setting of 3 W continuous to ablate the residual leukoplakia.  Bleeding from the biopsy site was controlled with pressure.  Patient was then awakened extubated and transferred to recovery in stable condition.    PATIENT DISPOSITION:  To PACU, stable

## 2023-01-07 NOTE — Discharge Instructions (Signed)
Diet as tolerated.  Avoid heavy lifting for 2 weeks.  Empty the drain, record the amount, and recharge every 8 hours.

## 2023-01-07 NOTE — Transfer of Care (Signed)
Immediate Anesthesia Transfer of Care Note  Patient: Ricardo [name]  Procedure(s) Performed: EXCISION SUBMANDIBULAR GLAND (Right: Neck) ORAL CAVITY BIOPSY WITH CO2 LASER EXCISION OF LEUKOPLAKIC (Left: Mouth)  Patient Location: PACU  Anesthesia Type:General  Level of Consciousness: awake  Airway & Oxygen Therapy: Patient Spontanous Breathing and Patient connected to face mask oxygen  Post-op Assessment: Report given to RN and Post -op Vital signs reviewed and stable  Post vital signs: Reviewed and stable  Last Vitals:  Vitals Value Taken Time  BP 157/104 01/07/23 1343  Temp    Pulse 87 01/07/23 1345  Resp 25 01/07/23 1345  SpO2 99 % 01/07/23 1345  Vitals shown include unvalidated device data.  Last Pain:  Vitals:   01/07/23 1042  TempSrc:   PainSc: 0-No pain         Complications: No notable events documented.

## 2023-01-07 NOTE — Anesthesia Postprocedure Evaluation (Signed)
Anesthesia Post Note  Patient: Ricardo [name]  Procedure(s) Performed: EXCISION SUBMANDIBULAR GLAND (Right: Neck) ORAL CAVITY BIOPSY WITH CO2 LASER EXCISION OF LEUKOPLAKIC (Left: Mouth)     Patient location during evaluation: PACU Anesthesia Type: General Level of consciousness: awake and alert Pain management: pain level controlled Vital Signs Assessment: post-procedure vital signs reviewed and stable Respiratory status: spontaneous breathing, nonlabored ventilation, respiratory function stable and patient connected to nasal cannula oxygen Cardiovascular status: blood pressure returned to baseline Postop Assessment: no apparent nausea or vomiting Anesthetic complications: no   No notable events documented.  Last Vitals:  Vitals:   01/07/23 1430 01/07/23 1445  BP: (!) 151/107 (!) 149/109  Pulse: 90 81  Resp: 15 19  Temp:  36.8 C  SpO2: 97% 92%    Last Pain:  Vitals:   01/07/23 1445  TempSrc:   PainSc: Asleep                 Beryle Lathe

## 2023-01-07 NOTE — Anesthesia Procedure Notes (Signed)
Procedure Name: Intubation Date/Time: 01/07/2023 12:02 PM  Performed by: Montez Morita, Antonios Ostrow W, CRNAPre-anesthesia Checklist: Patient identified, Emergency Drugs available, Suction available and Patient being monitored Patient Re-evaluated:Patient Re-evaluated prior to induction Oxygen Delivery Method: Circle system utilized Preoxygenation: Pre-oxygenation with 100% oxygen Induction Type: IV induction Ventilation: Mask ventilation without difficulty Laryngoscope Size: Miller and 2 Grade View: Grade I Tube type: Oral Tube size: 7.0 mm Number of attempts: 1 Airway Equipment and Method: Stylet Placement Confirmation: ETT inserted through vocal cords under direct vision, positive ETCO2 and breath sounds checked- equal and bilateral Secured at: 23 cm Tube secured with: Tape Dental Injury: Teeth and Oropharynx as per pre-operative assessment

## 2023-01-07 NOTE — Interval H&P Note (Signed)
History and Physical Interval Note:  01/07/2023 11:17 AM  Ricardo [name]  has presented today for surgery, with the diagnosis of Sialolithiasis of submandibular gland; Neck mass; Oral leukoplakia.  The various methods of treatment have been discussed with the patient and family. After consideration of risks, benefits and other options for treatment, the patient has consented to  Procedure(s): EXCISION SUBMANDIBULAR GLAND (Right) ORAL CAVITY BIOPSY WITH CO2 LASER EXCISION OF LEUKOPLAKIC (Left) as a surgical intervention.  The patient's history has been reviewed, patient examined, no change in status, stable for surgery.  I have reviewed the patient's chart and labs.  Questions were answered to the patient's satisfaction.     Serena Colonel

## 2023-01-07 NOTE — Anesthesia Preprocedure Evaluation (Addendum)
Anesthesia Evaluation  Patient identified by MRN, date of birth, ID band Patient awake    Reviewed: Allergy & Precautions, NPO status , Patient's Chart, lab work & pertinent test results  History of Anesthesia Complications Negative for: history of anesthetic complications  Airway Mallampati: II  TM Distance: >3 FB Neck ROM: Full    Dental  (+) Dental Advisory Given, Teeth Intact   Pulmonary Current SmokerPatient did not abstain from smoking.   Pulmonary exam normal        Cardiovascular hypertension, Pt. on medications Normal cardiovascular exam     Neuro/Psych negative neurological ROS  negative psych ROS   GI/Hepatic PUD,GERD  Controlled,,(+)     substance abuse  alcohol use and marijuana use  Endo/Other   Obesity   Renal/GU negative Renal ROS     Musculoskeletal negative musculoskeletal ROS (+)    Abdominal   Peds  Hematology negative hematology ROS (+)   Anesthesia Other Findings   Reproductive/Obstetrics                             Anesthesia Physical Anesthesia Plan  ASA: 2  Anesthesia Plan: General   Post-op Pain Management: Tylenol PO (pre-op)*   Induction: Intravenous  PONV Risk Score and Plan: 1 and Treatment may vary due to age or medical condition, Ondansetron, Dexamethasone and Midazolam  Airway Management Planned: Oral ETT  Additional Equipment: None  Intra-op Plan:   Post-operative Plan: Extubation in OR  Informed Consent: I have reviewed the patients History and Physical, chart, labs and discussed the procedure including the risks, benefits and alternatives for the proposed anesthesia with the patient or authorized representative who has indicated his/her understanding and acceptance.     Dental advisory given  Plan Discussed with: CRNA and Anesthesiologist  Anesthesia Plan Comments:        Anesthesia Quick Evaluation

## 2023-01-08 ENCOUNTER — Encounter (HOSPITAL_COMMUNITY): Payer: Self-pay | Admitting: Otolaryngology

## 2023-01-10 DIAGNOSIS — K115 Sialolithiasis: Secondary | ICD-10-CM | POA: Diagnosis not present

## 2023-01-10 DIAGNOSIS — K1321 Leukoplakia of oral mucosa, including tongue: Secondary | ICD-10-CM | POA: Diagnosis not present

## 2023-01-10 LAB — SURGICAL PATHOLOGY

## 2023-01-15 ENCOUNTER — Encounter (HOSPITAL_COMMUNITY): Payer: Self-pay | Admitting: Otolaryngology

## 2023-01-15 DIAGNOSIS — A630 Anogenital (venereal) warts: Secondary | ICD-10-CM | POA: Diagnosis not present

## 2023-01-15 NOTE — Addendum Note (Signed)
Addendum  created 01/15/23 1447 by Beryle Lathe, MD   Intraprocedure Event edited, Intraprocedure Staff edited

## 2023-01-28 DIAGNOSIS — K600 Acute anal fissure: Secondary | ICD-10-CM | POA: Diagnosis not present

## 2023-03-04 IMAGING — DX DG CHEST 2V
2 series · 2 of 2 positions shown · non-contrast
Comparison: None.

CLINICAL DATA: Diaphoresis.  Tobacco use.

EXAM:
CHEST - 2 VIEW

[dg chest 2 view (1 of 2)]
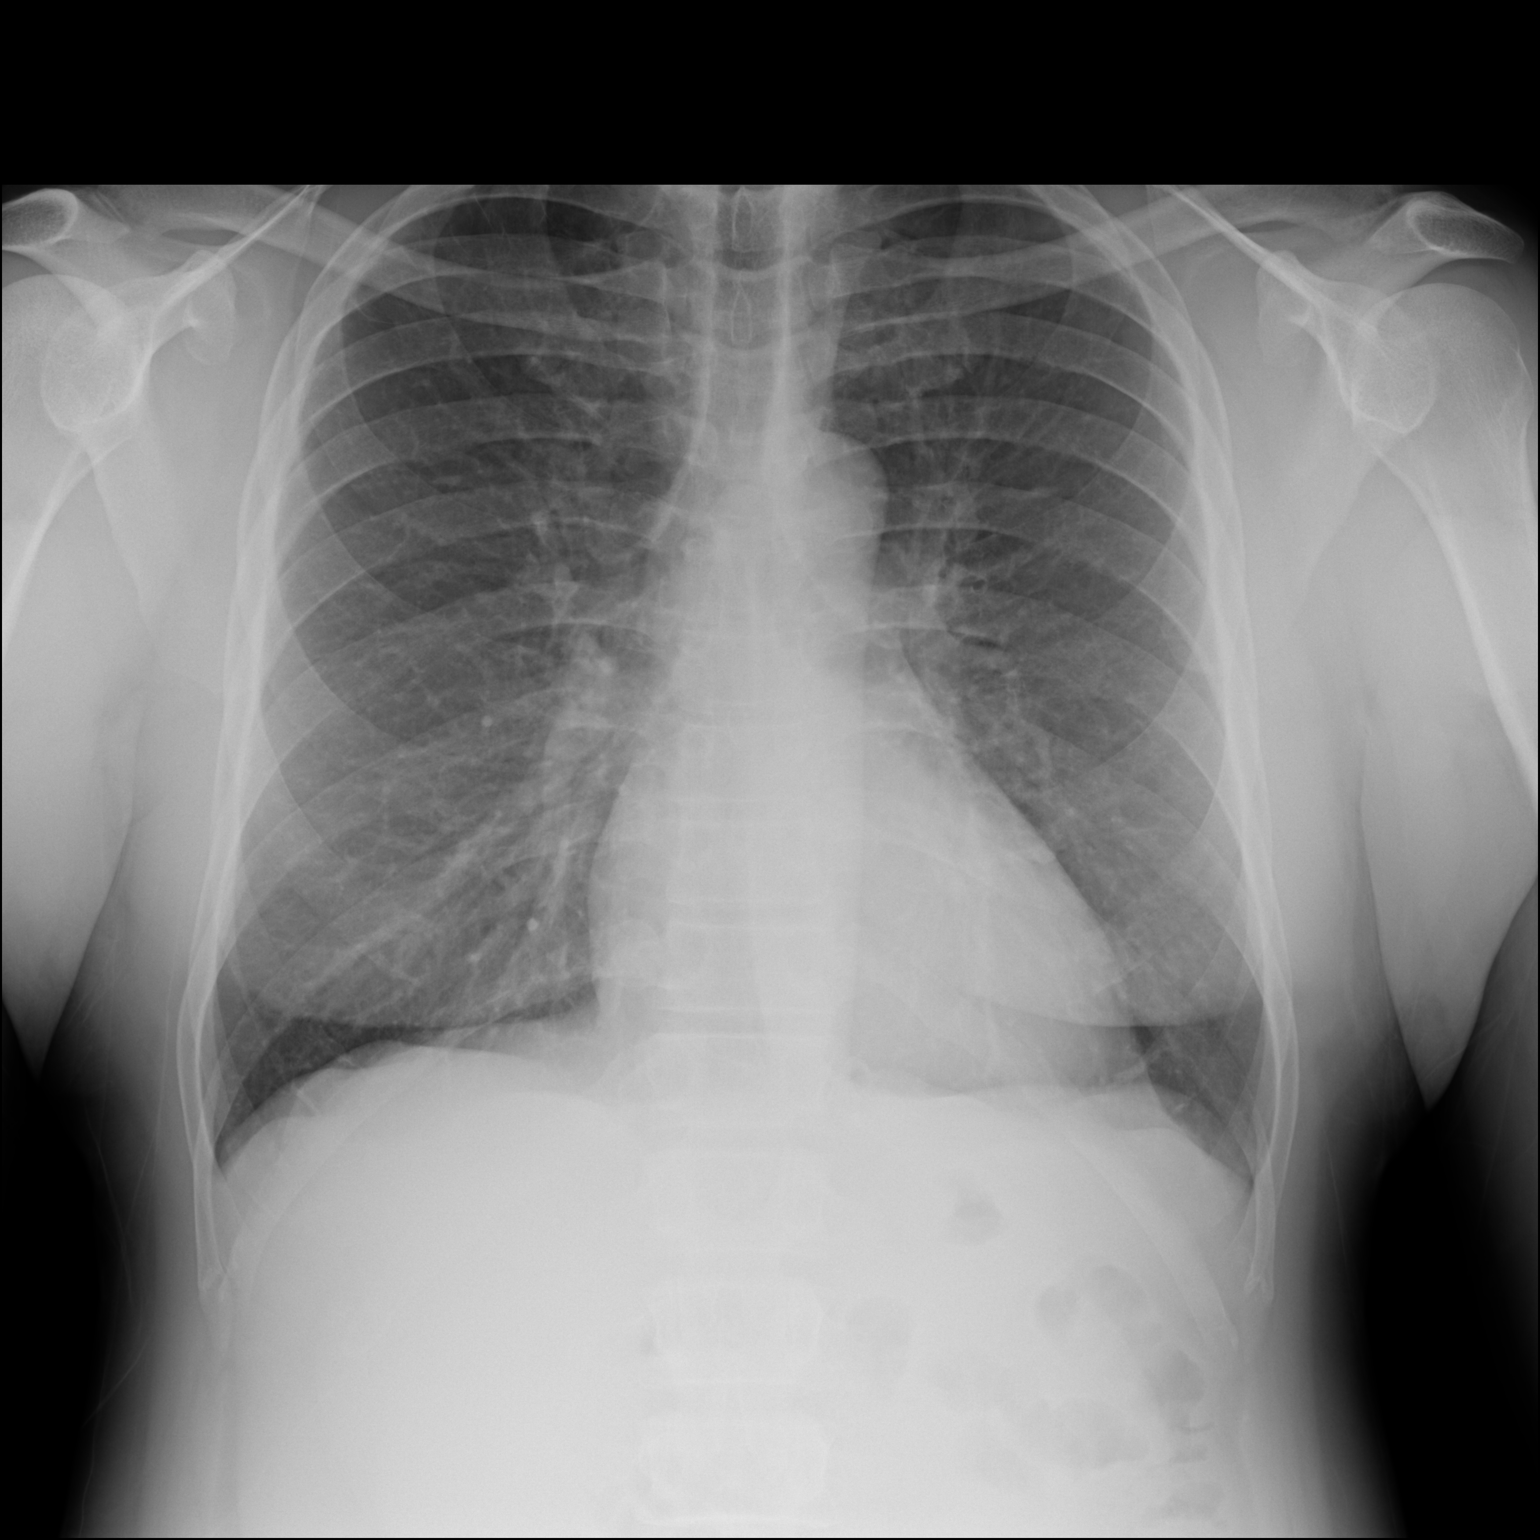

[dg chest 2 view (2 of 2)]
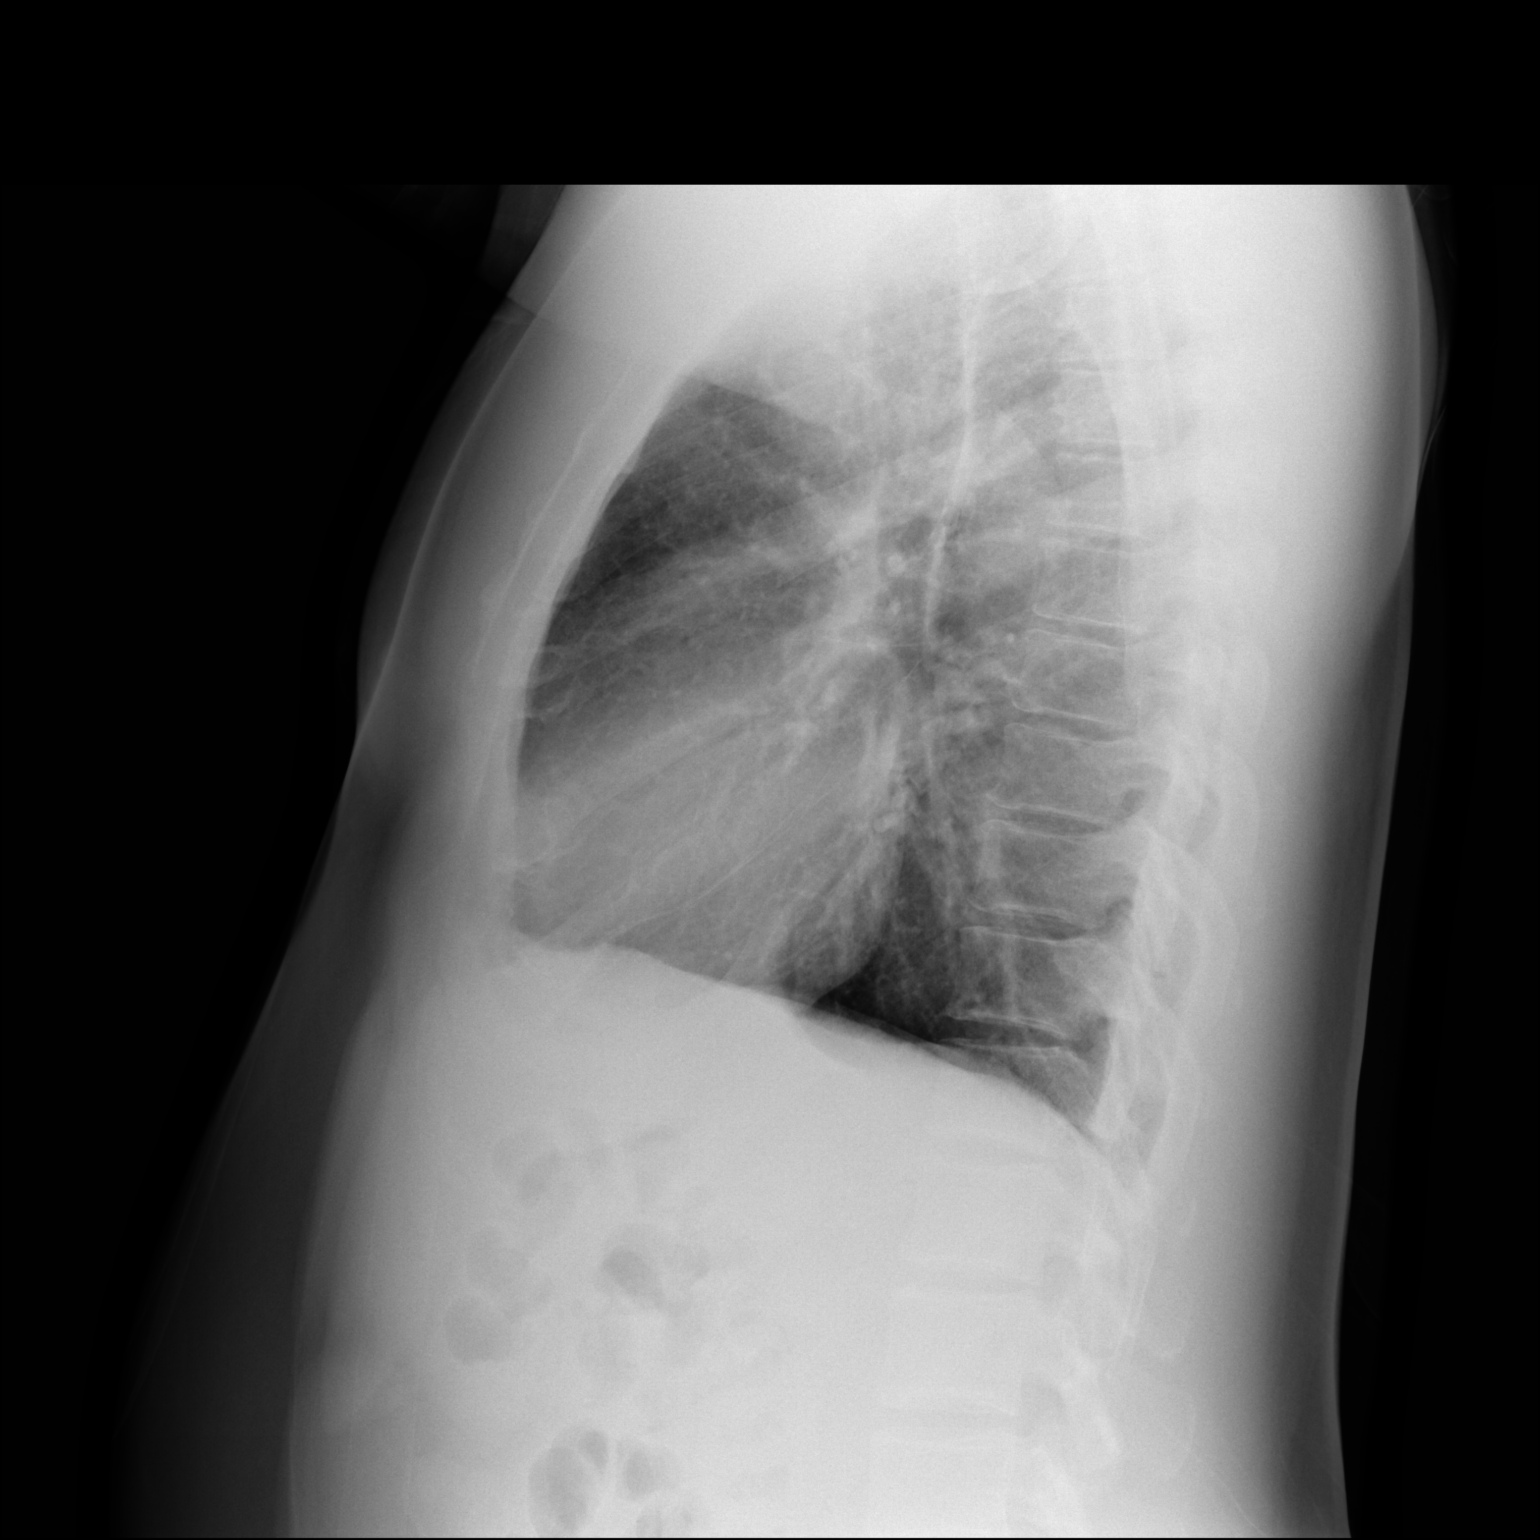

[2 of 2 positions shown; findings below may reference images not displayed]

FINDINGS: The heart size and mediastinal contours are within normal limits.
Both lungs are clear. The visualized skeletal structures are
unremarkable.
IMPRESSION: No active cardiopulmonary disease.

## 2023-03-22 DIAGNOSIS — M25512 Pain in left shoulder: Secondary | ICD-10-CM | POA: Diagnosis not present

## 2023-03-22 DIAGNOSIS — M542 Cervicalgia: Secondary | ICD-10-CM | POA: Diagnosis not present

## 2023-04-10 DIAGNOSIS — R238 Other skin changes: Secondary | ICD-10-CM | POA: Diagnosis not present

## 2023-04-10 DIAGNOSIS — B078 Other viral warts: Secondary | ICD-10-CM | POA: Diagnosis not present

## 2023-05-22 DIAGNOSIS — A630 Anogenital (venereal) warts: Secondary | ICD-10-CM | POA: Diagnosis not present

## 2023-06-12 DIAGNOSIS — A630 Anogenital (venereal) warts: Secondary | ICD-10-CM | POA: Diagnosis not present

## 2023-10-27 DIAGNOSIS — K047 Periapical abscess without sinus: Secondary | ICD-10-CM | POA: Diagnosis not present

## 2024-01-13 DIAGNOSIS — M542 Cervicalgia: Secondary | ICD-10-CM | POA: Diagnosis not present

## 2024-01-13 DIAGNOSIS — M7918 Myalgia, other site: Secondary | ICD-10-CM | POA: Insufficient documentation

## 2024-01-13 DIAGNOSIS — M5412 Radiculopathy, cervical region: Secondary | ICD-10-CM | POA: Diagnosis not present

## 2024-01-13 DIAGNOSIS — M25512 Pain in left shoulder: Secondary | ICD-10-CM | POA: Diagnosis not present

## 2024-03-17 ENCOUNTER — Ambulatory Visit
Admission: EM | Admit: 2024-03-17 | Discharge: 2024-03-17 | Disposition: A | Discharge status: Home / Self Care | Attending: Family Medicine | Admitting: Family Medicine

## 2024-03-17 ENCOUNTER — Ambulatory Visit

## 2024-03-17 DIAGNOSIS — M542 Cervicalgia: Secondary | ICD-10-CM | POA: Diagnosis not present

## 2024-03-17 DIAGNOSIS — Z202 Contact with and (suspected) exposure to infections with a predominantly sexual mode of transmission: Secondary | ICD-10-CM | POA: Diagnosis not present

## 2024-03-17 MED ORDER — PREDNISONE 20 MG PO TABS: 40.0000 mg | ORAL_TABLET | Freq: Every day | ORAL | 0 refills | Status: AC

## 2024-03-17 MED ORDER — KETOROLAC TROMETHAMINE 30 MG/ML IJ SOLN
30.0000 mg | Freq: Once | INTRAMUSCULAR | Status: AC
  Administered 2024-03-17: 30 mg via INTRAMUSCULAR

## 2024-03-17 NOTE — Discharge Instructions (Signed)
 You have been given a shot of Toradol  30 mg today.  Staff will notify you if there is anything positive on the swab.  Take prednisone  20 mg--2 daily for 5 days

## 2024-03-17 NOTE — ED Provider Notes (Signed)
 [name]    CSN: [id number] Arrival date & time: 03/17/24  1048      History   Chief Complaint No chief complaint on file.   HPI Ricardo [name] is a 43 y.o. male.   HPI Here for left-sided neck and posterior shoulder pain.  This been going on for several years but it has been worse lately.  No recent trauma, no fever and no rash  He has not taken anything for the pain today.  He usually takes methocarbamol  as needed.  NKDA  He also experienced some burning in his urethra about 3 and 4 days ago that was not really dysuria but more of a constant discomfort.  That has resolved.  He is currently having no dysuria or penile discharge or itching.  He requests testing for STDs, but declines HIV and RPR testing.   Past Medical History:  Diagnosis Date   Facial pain    able to eat and swallow.06/05/2021   GERD (gastroesophageal reflux disease)    Hypertension    pt has run out of high blood pressure and is not taking anything for his hypertension.  needs to schedule an appointment with dr. 06/05/2021   Neck pain    able to move his neck with out any problems. 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   Irritable bowel syndrome 07/09/2012    Past Surgical History:  Procedure Laterality Date   boil     EXCISION ORAL LESION WITH CO2 LASER Left 01/07/2023   Procedure: ORAL CAVITY BIOPSY WITH CO2 LASER EXCISION OF LEUKOPLAKIC;  Surgeon: Jesus Oliphant, MD;  Location: [facility] OR;  Service: ENT;  Laterality: Left;   LASER ABLATION CONDOLAMATA N/A 06/08/2021   Procedure: LASER ABLATION CONDOLAMATA;  Surgeon: Debby Hila, MD;  Location: [facility] [place];  Service: General;  Laterality: N/A;   no past surgery     RECTAL EXAM UNDER ANESTHESIA N/A 06/08/2021   Procedure: RECTAL EXAM UNDER ANESTHESIA;   Surgeon: Debby Hila, MD;  Location: [facility] [place];  Service: General;  Laterality: N/A;   SKIN LESION EXCISION     30 yrs ago had boil removed from his butt. 06/05/2021   SUBMANDIBULAR GLAND EXCISION Right 01/07/2023   Procedure: EXCISION SUBMANDIBULAR GLAND;  Surgeon: Jesus Oliphant, MD;  Location: [facility] OR;  Service: ENT;  Laterality: Right;       Home Medications    Prior to Admission medications   Medication Sig Start Date End Date Taking? Authorizing Provider  amLODipine  (NORVASC ) 5 MG tablet Take 10 mg by mouth daily.   Yes [provider]  HYDROcodone -acetaminophen  (NORCO) 7.5-325 MG tablet Take 1 tablet by mouth every 6 (six) hours as needed for moderate pain. 01/07/23  Yes Jesus Oliphant, MD  lidocaine  (LIDODERM ) 5 % Place 1 patch onto the skin daily. Remove & Discard patch within 12 hours or as directed by MD 08/21/22  Yes Sponseller, Pleasant R, PA-C  methocarbamol  (ROBAXIN ) 500 MG tablet Take 1 tablet (500 mg total) by mouth 2 (two) times daily as needed for muscle spasms. 08/21/22  Yes Sponseller, Rebekah R, PA-C  predniSONE  (DELTASONE ) 20 MG tablet Take 2 tablets (40 mg total) by mouth daily with breakfast for 5 days. 03/17/24 03/22/24 Yes Vonna Sharlet POUR, MD  Family History Family History  Problem Relation Age of Onset   Irritable bowel syndrome Sister    Diabetes Sister    Hyperlipidemia Sister    Hypertension Sister    Hypertension Mother    Hypertension Father    Hyperlipidemia Father     Social History Social History   Tobacco Use   Smoking status: Every Day    Types: Cigars   Smokeless tobacco: Never  Vaping Use   Vaping status: Never Used  Substance Use Topics   Alcohol use: Yes    Alcohol/week: 7.0 standard drinks of alcohol    Types: 3 Cans of beer, 4 Shots of liquor per week   Drug use: Yes    Frequency: 4.0 times per week    Types: Marijuana     Allergies   Latex   Review of Systems Review of Systems   Physical  Exam Triage Vital Signs ED Triage Vitals  Encounter Vitals Group     BP 03/17/24 1058 (!) 145/98     Girls Systolic BP Percentile --      Girls Diastolic BP Percentile --      Boys Systolic BP Percentile --      Boys Diastolic BP Percentile --      Pulse Rate 03/17/24 1058 84     Resp 03/17/24 1058 20     Temp 03/17/24 1058 97.9 F (36.6 C)     Temp Source 03/17/24 1058 Oral     SpO2 03/17/24 1058 98 %     Weight --      Height --      Head Circumference --      Peak Flow --      Pain Score 03/17/24 1101 6     Pain Loc --      Pain Education --      Exclude from Growth Chart --    No data found.  Updated Vital Signs BP (!) 145/98 (BP Location: Left Arm)   Pulse 84   Temp 97.9 F (36.6 C) (Oral)   Resp 20   SpO2 98%   Visual Acuity Right Eye Distance:   Left Eye Distance:   Bilateral Distance:    Right Eye Near:   Left Eye Near:    Bilateral Near:     Physical Exam Vitals reviewed.  Constitutional:      General: He is not in acute distress.    Appearance: He is not toxic-appearing.  HENT:     Mouth/Throat:     Mouth: Mucous membranes are moist.  Neck:     Comments: There is some tenderness and spasm of the left trapezius from the neck into the posterior shoulder.Lymphadenopathy:     Cervical: No cervical adenopathy.   Skin:    Coloration: Skin is not pale.     Findings: No rash.   Neurological:     Mental Status: He is alert and oriented to person, place, and time.   Psychiatric:        Behavior: Behavior normal.      UC Treatments / Results  Labs (all labs ordered are listed, but only abnormal results are displayed) Labs Reviewed  CYTOLOGY, (ORAL, ANAL, URETHRAL) ANCILLARY ONLY    EKG   Radiology No results found.  Procedures Procedures (including critical care time)  Medications Ordered in UC Medications  ketorolac  (TORADOL ) 30 MG/ML injection 30 mg (30 mg Intramuscular Given 03/17/24 1116)    Initial Impression / Assessment and  Plan / UC  Course  I have reviewed the triage vital signs and the nursing notes.  Pertinent labs & imaging results that were available during my care of the patient were reviewed by me and considered in my medical decision making (see chart for details).     Toradol  injections given here for the pain and prednisone  for 5 days is sent into the pharmacy for inflammation in his neck muscles.  Urethral self swab is done and staff will notify him of any positives and treat per protocol.   Final Clinical Impressions(s) / UC Diagnoses   Final diagnoses:  Neck pain  Potential exposure to STD     Discharge Instructions      You have been given a shot of Toradol  30 mg today.  Staff will notify you if there is anything positive on the swab.  Take prednisone  20 mg--2 daily for 5 days      ED Prescriptions     Medication Sig Dispense Auth. Provider   predniSONE  (DELTASONE ) 20 MG tablet Take 2 tablets (40 mg total) by mouth daily with breakfast for 5 days. 10 tablet Vonna Lue Sykora K, MD      PDMP not reviewed this encounter.   Vonna Sharlet POUR, MD 03/17/24 [phone]

## 2024-03-17 NOTE — ED Triage Notes (Signed)
 Patient request STI testing. States he feels irritation.  Patient request a injection for pain in his neck. He states he neck is inflamed and this is recurrent issue for him.

## 2024-03-18 LAB — CYTOLOGY, (ORAL, ANAL, URETHRAL) ANCILLARY ONLY
Chlamydia: NEGATIVE
Comment: NEGATIVE
Comment: NEGATIVE
Comment: NORMAL
Neisseria Gonorrhea: NEGATIVE
Trichomonas: NEGATIVE

## 2024-05-04 ENCOUNTER — Ambulatory Visit
Admission: EM | Admit: 2024-05-04 | Discharge: 2024-05-04 | Disposition: A | Discharge status: Home / Self Care | Attending: Emergency Medicine | Admitting: Emergency Medicine

## 2024-05-04 ENCOUNTER — Encounter: Payer: Self-pay | Admitting: Emergency Medicine

## 2024-05-04 DIAGNOSIS — R197 Diarrhea, unspecified: Secondary | ICD-10-CM | POA: Insufficient documentation

## 2024-05-04 DIAGNOSIS — S01511A Laceration without foreign body of lip, initial encounter: Secondary | ICD-10-CM | POA: Diagnosis not present

## 2024-05-04 DIAGNOSIS — J029 Acute pharyngitis, unspecified: Secondary | ICD-10-CM | POA: Diagnosis not present

## 2024-05-04 DIAGNOSIS — L089 Local infection of the skin and subcutaneous tissue, unspecified: Secondary | ICD-10-CM | POA: Diagnosis not present

## 2024-05-04 DIAGNOSIS — R195 Other fecal abnormalities: Secondary | ICD-10-CM | POA: Insufficient documentation

## 2024-05-04 DIAGNOSIS — K219 Gastro-esophageal reflux disease without esophagitis: Secondary | ICD-10-CM | POA: Diagnosis not present

## 2024-05-04 DIAGNOSIS — K625 Hemorrhage of anus and rectum: Secondary | ICD-10-CM | POA: Insufficient documentation

## 2024-05-04 MED ORDER — PANTOPRAZOLE SODIUM 20 MG PO TBEC: 20.0000 mg | DELAYED_RELEASE_TABLET | Freq: Every day | ORAL | 0 refills | Status: AC

## 2024-05-04 MED ORDER — AMOXICILLIN-POT CLAVULANATE 875-125 MG PO TABS: 1.0000 | ORAL_TABLET | Freq: Two times a day (BID) | ORAL | 0 refills | Status: AC

## 2024-05-04 MED ORDER — FAMOTIDINE 20 MG PO TABS: 20.0000 mg | ORAL_TABLET | Freq: Two times a day (BID) | ORAL | 0 refills | Status: AC

## 2024-05-04 NOTE — ED Triage Notes (Signed)
 Pt report biting down hard on his lip x3 days ago while eating a meal. The area is painful and swollen per pt. He is concerned for infection to the site.   Pt also notes L-sided sore throat that started yesterday. No congestion, cough, fevers, chills, or other associated symptoms. No med use for either issue.

## 2024-05-04 NOTE — ED Provider Notes (Signed)
 HPI  SUBJECTIVE:  Ricardo [name] is a 43 y.o. male who presents with 2 issues: First, he bit his lower lip yesterday.  Reports pain, swelling, but no purulent drainage.  He has no other oral injury.  No fevers.  He has not tried anything for symptoms.  No alleviating factors.  Symptoms are worse with looking his lips.  Second, he reports a left-sided sore throat with swallowing starting today.  He had GERD symptoms this morning.  No drooling, trismus, sensation of throat swelling shut, voice changes, neck stiffness. He has a past medical history of hypertension, GERD, ulcerative proctitis, IBS  Past Medical History:  Diagnosis Date   Facial pain    able to eat and swallow.06/05/2021   GERD (gastroesophageal reflux disease)    Hypertension    pt has run out of high blood pressure and is not taking anything for his hypertension.  needs to schedule an appointment with dr. 06/05/2021   Neck pain    able to move his neck with out any problems. 06/05/2021   Swollen gland    swells intermittently - neck    Past Surgical History:  Procedure Laterality Date   boil     EXCISION ORAL LESION WITH CO2 LASER Left 01/07/2023   Procedure: ORAL CAVITY BIOPSY WITH CO2 LASER EXCISION OF LEUKOPLAKIC;  Surgeon: Jesus Oliphant, MD;  Location: [facility] OR;  Service: ENT;  Laterality: Left;   LASER ABLATION CONDOLAMATA N/A 06/08/2021   Procedure: LASER ABLATION CONDOLAMATA;  Surgeon: Debby Hila, MD;  Location: [facility] [place];  Service: General;  Laterality: N/A;   no past surgery     RECTAL EXAM UNDER ANESTHESIA N/A 06/08/2021   Procedure: RECTAL EXAM UNDER ANESTHESIA;  Surgeon: Debby Hila, MD;  Location: [facility] [place];  Service: General;  Laterality: N/A;   SKIN LESION EXCISION     30 yrs ago had boil removed from his butt. 06/05/2021   SUBMANDIBULAR GLAND EXCISION Right 01/07/2023   Procedure: EXCISION SUBMANDIBULAR GLAND;  Surgeon: Jesus Oliphant, MD;  Location: [facility] OR;  Service:  ENT;  Laterality: Right;    Family History  Problem Relation Age of Onset   Irritable bowel syndrome Sister    Diabetes Sister    Hyperlipidemia Sister    Hypertension Sister    Hypertension Mother    Hypertension Father    Hyperlipidemia Father     Social History   Tobacco Use   Smoking status: Every Day    Types: Cigars   Smokeless tobacco: Never  Vaping Use   Vaping status: Never Used  Substance Use Topics   Alcohol use: Yes    Alcohol/week: 7.0 standard drinks of alcohol    Types: 3 Cans of beer, 4 Shots of liquor per week   Drug use: Yes    Frequency: 4.0 times per week    Types: Marijuana    No current facility-administered medications for this encounter.  Current Outpatient Medications:    amLODipine  (NORVASC ) 5 MG tablet, Take 10 mg by mouth daily., Disp: , Rfl:    amoxicillin -clavulanate (AUGMENTIN ) 875-125 MG tablet, Take 1 tablet by mouth every 12 (twelve) hours for 7 days., Disp: 14 tablet, Rfl: 0   famotidine  (PEPCID ) 20 MG tablet, Take 1 tablet (20 mg total) by mouth 2 (two) times daily., Disp: 40 tablet, Rfl: 0   pantoprazole  (PROTONIX ) 20 MG tablet, Take 1 tablet (20 mg total) by mouth daily., Disp: 30 tablet, Rfl: 0   imiquimod (ALDARA) 5 % cream,  Apply topically. (Patient not taking: Reported on 05/04/2024), Disp: , Rfl:   Allergies  Allergen Reactions   Latex Rash and Dermatitis     ROS  As noted in HPI.   Physical Exam  BP (!) 138/94 (BP Location: Right Arm)   Pulse 90   Temp 98.1 F (36.7 C) (Oral)   Resp 18   SpO2 96%   Constitutional: Well developed, well nourished, no acute distress Eyes: PERRL, EOMI, conjunctiva normal bilaterally HENT: Normocephalic, 1 cm tender  laceration lower lip.  Lower lip swollen.  This is not a through and through laceration.  no expressible purulent drainage.   Normal tonsils without exudates.  Uvula midline.  Normal oropharynx.  No drooling, trismus, muffled voice, neck stiffness.  Neck: No cervical  lymphadenopathy Respiratory: Clear to auscultation bilaterally, no rales, no wheezing, no rhonchi Cardiovascular: Normal rate and rhythm, no murmurs, no gallops, no rubs GI: nondistended skin: No rash, skin intact Musculoskeletal: no deformities Neurologic: Alert & oriented x 3, CN III-XII grossly intact, no motor deficits, sensation grossly intact Psychiatric: Speech and behavior appropriate   ED Course   Medications - No data to display  No orders of the defined types were placed in this encounter.  No results found for this or any previous visit (from the past 24 hours). No results found.  ED Clinical Impression  1. Infected lip laceration, initial encounter   2. Sore throat   3. Gastroesophageal reflux disease, unspecified whether esophagitis present      ED Assessment/Plan    1.  Lip laceration.  Concern for secondary infection.  Augmentin  for 7 days, salt water  rinses, warm and cool compresses, whichever feels better.  Tylenol  and ibuprofen  together 3-4 times a day as needed for pain.  2.  Sore throat.  Suspect acid reflux since symptoms started after having GERD symptoms this morning.  Doubt strep, mono, COVID, thus testing was not done.  Will send sent home with Pepcid /Protonix .  Follow-up with PCP as needed.  Discussed  MDM, treatment plan, and plan for follow-up with patient  patient agrees with plan.   Meds ordered this encounter  Medications   amoxicillin -clavulanate (AUGMENTIN ) 875-125 MG tablet    Sig: Take 1 tablet by mouth every 12 (twelve) hours for 7 days.    Dispense:  14 tablet    Refill:  0   famotidine  (PEPCID ) 20 MG tablet    Sig: Take 1 tablet (20 mg total) by mouth 2 (two) times daily.    Dispense:  40 tablet    Refill:  0   pantoprazole  (PROTONIX ) 20 MG tablet    Sig: Take 1 tablet (20 mg total) by mouth daily.    Dispense:  30 tablet    Refill:  0      *This clinic note was created using Scientist, clinical (histocompatibility and immunogenetics). Therefore, there may  be occasional mistakes despite careful proofreading. ?    Van Knee, MD 05/07/24 1500

## 2024-05-04 NOTE — Discharge Instructions (Signed)
 When she Augmentin , even if you feel better.  Warm or cool compresses, whichever feels better.  Bacitracin  antibiotic ointment.  Take 600 mg of ibuprofen  combined with 1000 mg Tylenol  together 3-4 times a day as needed for pain.  Suspect her sore throat is coming from acid reflux.  Try the Pepcid  and Protonix .  You can increase the Protonix  to 1 pill twice a day.  Some people find salt water  gargles and  Traditional Medicinal's Throat Coat tea helpful.  You can also take 5 mL of liquid Benadryl and 5 mL of Maalox/Mylanta. Mix it together, and then hold it in your mouth for as long as you can and then swallow. You may do this 4 times a day.  Honey and lemon dissolved in hot water  can also be soothing.  Up with your primary care provider if not getting better.  Go to the ER if you get worse.  Go to www.goodrx.com  or www.costplusdrugs.com to look up your medications. This will give you a list of where you can find your prescriptions at the most affordable prices. Or ask the pharmacist what the cash price is, or if they have any other discount programs available to help make your medication more affordable. This can be less expensive than what you would pay with insurance.

## 2024-05-26 ENCOUNTER — Ambulatory Visit
Admission: EM | Admit: 2024-05-26 | Discharge: 2024-05-26 | Disposition: A | Discharge status: Home / Self Care | Attending: Emergency Medicine | Admitting: Emergency Medicine

## 2024-05-26 DIAGNOSIS — J351 Hypertrophy of tonsils: Secondary | ICD-10-CM

## 2024-05-26 DIAGNOSIS — J029 Acute pharyngitis, unspecified: Secondary | ICD-10-CM | POA: Diagnosis not present

## 2024-05-26 DIAGNOSIS — H60502 Unspecified acute noninfective otitis externa, left ear: Secondary | ICD-10-CM | POA: Diagnosis not present

## 2024-05-26 LAB — POCT RAPID STREP A (OFFICE): Rapid Strep A Screen: NEGATIVE

## 2024-05-26 MED ORDER — IBUPROFEN 800 MG PO TABS: 800.0000 mg | ORAL_TABLET | Freq: Three times a day (TID) | ORAL | 0 refills | Status: AC

## 2024-05-26 MED ORDER — LIDOCAINE VISCOUS HCL 2 % MT SOLN: 15.0000 mL | OROMUCOSAL | 0 refills | Status: AC | PRN

## 2024-05-26 MED ORDER — IBUPROFEN 800 MG PO TABS
800.0000 mg | ORAL_TABLET | Freq: Once | ORAL | Status: AC
  Administered 2024-05-26: 800 mg via ORAL

## 2024-05-26 MED ORDER — OFLOXACIN 0.3 % OT SOLN: 3.0000 [drp] | Freq: Two times a day (BID) | OTIC | 0 refills | Status: AC

## 2024-05-26 NOTE — ED Notes (Signed)
 Call patient from the car. Patient stated he will be right over. No answer in the waiting area.

## 2024-05-26 NOTE — Discharge Instructions (Addendum)
 Your strep test was negative.  You have one large tonsil that needs to be evaluated by the ENT. Call your clinic to make a new appointment. Their information is provided below for your convenience.   For pain please use ibuprofen . You can take one pill every 6 hours You can also use tylenol . The lidocaine  is a numbing medicine.  Gargle and spit out as often as needed for throat pain.   You have an infection of your left ear canal.  This is treated with antibiotic eardrops.  Use twice daily for 5 days.  Please do not use any Q-tips or other objects in the ear.

## 2024-05-26 NOTE — ED Triage Notes (Signed)
 Patient presents to the office for left side ear pain and sore throat x 2 weeks. Denies any other symptoms. Patient has not taking any medication for relief.

## 2024-05-26 NOTE — ED Provider Notes (Signed)
 [name]    CSN: [id number] Arrival date & time: 05/26/24  0946      History   Chief Complaint Chief Complaint  Patient presents with   Sore Throat   Ear Pain    HPI Ricardo [name] is a 42 y.o. male.  Sore throat intermittent for 2-3 weeks 3 weeks ago he was treated with augmentin  for a lip infection, was having throat pain at that time. He finished the antibiotics but sore throat still occurring.   Reports history of neck pain and intermittent gland swelling He has an ENT, but forgot their number so didn't call them  Submandibular gland excision in his chart  Has not attempted any interventions for his pain  Denies fever, congestion, cough  Does have some left ear pain that started yesterday, may have got some water  in the ear. Used a q-tip that hurt No hearing changes  Past Medical History:  Diagnosis Date   Facial pain    able to eat and swallow.06/05/2021   GERD (gastroesophageal reflux disease)    Hypertension    pt has run out of high blood pressure and is not taking anything for his hypertension.  needs to schedule an appointment with dr. 06/05/2021   Neck pain    able to move his neck with out any problems. 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   Cervical spondylosis without myelopathy 07/11/2022   Lumbar radiculopathy 01/02/2022   Cervical radiculopathy 12/04/2021   Facial pain    Facial swelling 05/18/2021   Complex regional pain syndrome of upper extremity 04/20/2021   Atypical facial pain 04/20/2021   Neck pain on left side 11/01/2020   Pain in joint of left shoulder 07/07/2020   Strain of left trapezius muscle  07/07/2020   Cervicalgia 07/07/2020   Sialolithiasis of submandibular gland 08/04/2019   Neck mass 08/04/2019   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   Irritable bowel syndrome 07/09/2012    Past Surgical History:  Procedure Laterality Date   boil     EXCISION ORAL LESION WITH CO2 LASER Left 01/07/2023   Procedure: ORAL CAVITY BIOPSY WITH CO2 LASER EXCISION OF LEUKOPLAKIC;  Surgeon: Jesus Oliphant, MD;  Location: [facility] OR;  Service: ENT;  Laterality: Left;   LASER ABLATION CONDOLAMATA N/A 06/08/2021   Procedure: LASER ABLATION CONDOLAMATA;  Surgeon: Debby Hila, MD;  Location: [facility] [place];  Service: General;  Laterality: N/A;   no past surgery     RECTAL EXAM UNDER ANESTHESIA N/A 06/08/2021   Procedure: RECTAL EXAM UNDER ANESTHESIA;  Surgeon: Debby Hila, MD;  Location: [facility];  Service: General;  Laterality: N/A;   SKIN LESION EXCISION     30 yrs ago had boil removed from his butt. 06/05/2021   SUBMANDIBULAR GLAND EXCISION Right 01/07/2023   Procedure: EXCISION SUBMANDIBULAR GLAND;  Surgeon: Jesus Oliphant, MD;  Location: [facility] OR;  Service: ENT;  Laterality: Right;       Home Medications    Prior to Admission medications   Medication Sig Start Date End Date Taking? Authorizing Provider  amLODipine  (NORVASC ) 5 MG tablet Take 10 mg by mouth daily.  Yes [provider]  ibuprofen  (ADVIL ) 800 MG tablet Take 1 tablet (800 mg total) by mouth 3 (three) times daily. 05/26/24  Yes Lilee Aldea, Asberry, PA-C  lidocaine  (XYLOCAINE ) 2 % solution Use as directed 15 mLs in the mouth or throat every 3 (three) hours as needed for mouth pain. Swish/gargle and spit out 05/26/24  Yes Mithra Spano, Asberry, PA-C  ofloxacin  (FLOXIN ) 0.3 % OTIC solution Place 3 drops into the left ear 2 (two) times daily for 5 days. 05/26/24 05/31/24 Yes Zacari Stiff, Asberry, PA-C  famotidine  (PEPCID ) 20 MG tablet Take 1 tablet (20 mg total) by mouth 2 (two) times daily. 05/04/24   Van Knee, MD   imiquimod LARETTA) 5 % cream Apply topically. Patient not taking: Reported on 05/04/2024 08/07/23   [provider]  pantoprazole  (PROTONIX ) 20 MG tablet Take 1 tablet (20 mg total) by mouth daily. 05/04/24   Van Knee, MD    Family History Family History  Problem Relation Age of Onset   Irritable bowel syndrome Sister    Diabetes Sister    Hyperlipidemia Sister    Hypertension Sister    Hypertension Mother    Hypertension Father    Hyperlipidemia Father     Social History Social History   Tobacco Use   Smoking status: Every Day    Types: Cigars   Smokeless tobacco: Never  Vaping Use   Vaping status: Never Used  Substance Use Topics   Alcohol use: Yes    Alcohol/week: 7.0 standard drinks of alcohol    Types: 3 Cans of beer, 4 Shots of liquor per week   Drug use: Yes    Frequency: 4.0 times per week    Types: Marijuana     Allergies   Latex   Review of Systems Review of Systems  As per HPI  Physical Exam Triage Vital Signs ED Triage Vitals [05/26/24 1124]  Encounter Vitals Group     BP (!) 149/94     Girls Systolic BP Percentile      Girls Diastolic BP Percentile      Boys Systolic BP Percentile      Boys Diastolic BP Percentile      Pulse Rate 89     Resp 18     Temp 98 F (36.7 C)     Temp Source Oral     SpO2 99 %     Weight      Height      Head Circumference      Peak Flow      Pain Score      Pain Loc      Pain Education      Exclude from Growth Chart    No data found.  Updated Vital Signs BP (!) 149/94 (BP Location: Left Arm)   Pulse 89   Temp 98 F (36.7 C) (Oral)   Resp 18   SpO2 99%   Physical Exam Vitals and nursing note reviewed.  Constitutional:      General: He is not in acute distress.    Appearance: Normal appearance. He is not ill-appearing or diaphoretic.  HENT:     Right Ear: Tympanic membrane and ear canal normal.     Left Ear: Tympanic membrane normal.     Ears:     Comments: Left canal mild  erythema, TM normal     Nose: No congestion or rhinorrhea.     Mouth/Throat:     Mouth: No oral lesions.     Pharynx:  Oropharynx is clear. Uvula midline. No pharyngeal swelling, oropharyngeal exudate or posterior oropharyngeal erythema.     Tonsils: No tonsillar exudate. 1+ on the right. 3+ on the left.     Comments: Normal phonation, tolerating secretions  Eyes:     Conjunctiva/sclera: Conjunctivae normal.     Pupils: Pupils are equal, round, and reactive to light.  Cardiovascular:     Rate and Rhythm: Normal rate and regular rhythm.     Pulses: Normal pulses.     Heart sounds: Normal heart sounds.  Pulmonary:     Effort: Pulmonary effort is normal.     Breath sounds: Normal breath sounds.  Abdominal:     Palpations: Abdomen is soft.     Tenderness: There is no abdominal tenderness.  Musculoskeletal:     Cervical back: Normal range of motion. No rigidity or tenderness.  Lymphadenopathy:     Cervical: No cervical adenopathy.  Skin:    General: Skin is warm and dry.  Neurological:     Mental Status: He is alert and oriented to person, place, and time.      UC Treatments / Results  Labs (all labs ordered are listed, but only abnormal results are displayed) Labs Reviewed  POCT RAPID STREP A (OFFICE) - Normal    EKG  Radiology No results found.  Procedures Procedures (including critical care time)  Medications Ordered in UC Medications  ibuprofen  (ADVIL ) tablet 800 mg (800 mg Oral Given 05/26/24 1222)    Initial Impression / Assessment and Plan / UC Course  I have reviewed the triage vital signs and the nursing notes.  Pertinent labs & imaging results that were available during my care of the patient were reviewed by me and considered in my medical decision making (see chart for details).  Afebrile, well appearing Left tonsillar hypotrophy without erythema or exudate.  He is asking for antibiotics and prednisone  today. We had a discussion about why this is  inappropriate, although patient is frustrated. Attempted to answer all questions.  Additionally he has not attempted any interventions yet for his pain, and I have discussed with him that there are many options we can use first that are more appropriate. Ibuprofen  dose in clinic, and sent prescription to pharmacy with lidocaine  for gargle.  With left tonsillar hypertrophy I have advised he call his ENT for follow up. The clinic information is provided in his AVS  The left ear canal has some irritation. Ofloxacin  drops are prescribed, advised not using any other objects in the ear   Final Clinical Impressions(s) / UC Diagnoses   Final diagnoses:  Sore throat  Acute otitis externa of left ear, unspecified type  Tonsillar hypertrophy, unilateral     Discharge Instructions      Your strep test was negative.  You have one large tonsil that needs to be evaluated by the ENT. Call your clinic to make a new appointment. Their information is provided below for your convenience.   For pain please use ibuprofen . You can take one pill every 6 hours You can also use tylenol . The lidocaine  is a numbing medicine.  Gargle and spit out as often as needed for throat pain.   You have an infection of your left ear canal.  This is treated with antibiotic eardrops.  Use twice daily for 5 days.  Please do not use any Q-tips or other objects in the ear.    ED Prescriptions     Medication Sig Dispense Auth. Provider   lidocaine  (  XYLOCAINE ) 2 % solution Use as directed 15 mLs in the mouth or throat every 3 (three) hours as needed for mouth pain. Swish/gargle and spit out 100 mL Candiace West, PA-C   ibuprofen  (ADVIL ) 800 MG tablet Take 1 tablet (800 mg total) by mouth 3 (three) times daily. 21 tablet Deaysia Grigoryan, PA-C   ofloxacin  (FLOXIN ) 0.3 % OTIC solution Place 3 drops into the left ear 2 (two) times daily for 5 days. 5 mL Epimenio Schetter, Asberry, PA-C      PDMP not reviewed this encounter.   Jeryl Asberry, PA-C 05/26/24 1430

## 2024-05-27 DIAGNOSIS — H66002 Acute suppurative otitis media without spontaneous rupture of ear drum, left ear: Secondary | ICD-10-CM | POA: Diagnosis not present

## 2024-05-28 DIAGNOSIS — M5412 Radiculopathy, cervical region: Secondary | ICD-10-CM | POA: Diagnosis not present

## 2024-08-10 DIAGNOSIS — K047 Periapical abscess without sinus: Secondary | ICD-10-CM | POA: Diagnosis not present

## 2024-08-17 ENCOUNTER — Encounter: Payer: Self-pay | Admitting: Physical Medicine and Rehabilitation

## 2024-08-17 ENCOUNTER — Ambulatory Visit: Admitting: Physical Medicine and Rehabilitation

## 2024-08-17 DIAGNOSIS — M25512 Pain in left shoulder: Secondary | ICD-10-CM

## 2024-08-17 DIAGNOSIS — G8929 Other chronic pain: Secondary | ICD-10-CM

## 2024-08-17 DIAGNOSIS — M7918 Myalgia, other site: Secondary | ICD-10-CM | POA: Diagnosis not present

## 2024-08-17 DIAGNOSIS — M542 Cervicalgia: Secondary | ICD-10-CM

## 2024-08-17 MED ORDER — PREDNISONE 50 MG PO TABS: 50.0000 mg | ORAL_TABLET | Freq: Every day | ORAL | 0 refills | Status: DC

## 2024-08-17 NOTE — Progress Notes (Unsigned)
 Ricardo [name] - 43 y.o. male MRN [id number]  Date of birth: 1981/05/26  Office Visit Note: Visit Date: 08/17/2024 PCP: Rexanne Ingle, MD Referred by: Rexanne Ingle, MD  Subjective: Chief Complaint  Patient presents with   Neck - Pain   HPI: Ricardo [name] is a 43 y.o. male who comes in today for evaluation of chronic, worsening and severe left sided neck pain radiating to shoulder to left trapezius region. He is patient of Dr. Ozell Finney. His has undergone multiple cervical myofascial trigger point injections with Dr. Finney, reports significant relief of pain with these procedures. Most recent was 01/13/2024 to left trapezius, deltoid and cervical paraspinal regions. His pain worsens with movement and activity. He describes pain as tight and sore sensation, currently rates as 7 out of 10. Some relief of pain with home exercise regimen, rest and use of medications. History of formal physical therapy with minimal relief of pain. Cervical MRI imaging from 2021 shows a hypoplastic or absent posterior arch without significant spinal canal stenosis at C1, minimal broad right central disc osteophyte complex at C5-C6 without deformity of the cord or spinal stenosis. He has also undergone both cervical epidural steroid injection and stellate ganglion block with Dr Bonner, minimal relief of pain with these procedures. Patient denies focal weakness, numbness and tingling. No recent trauma or falls.      Review of Systems  Musculoskeletal:  Positive for myalgias and neck pain.  Neurological:  Negative for tingling, sensory change, focal weakness and weakness.  All other systems reviewed and are negative.  Otherwise per HPI.  Assessment & Plan: Visit Diagnoses:    ICD-10-CM   1. Cervicalgia  M54.2 MR CERVICAL SPINE WO CONTRAST    2. Chronic left shoulder pain  M25.512    G89.29     3. Myofascial pain  M79.18        Plan: Findings:  Chronic, worsening and severe left sided neck pain  radiating to shoulder and upper back.  No radicular symptoms down the arms.  Patient continues to have severe pain despite good conservative therapies such as formal physical therapy, home exercise regimen, rest and use of medications.  Patient's clinical presentation and exam are consistent with myofascial pain syndrome.  There is myofascial tenderness noted to the left levator scapula and trapezius region upon palpation today.  Patient does have longstanding history of chronic myofascial pain, prior cervical trigger point injections done by Dr. Finney provided significant and lasting relief.  We discussed treatment plan in detail today.  I performed cervical myofascial trigger point injections in the office today, he tolerated without difficulty.  He also requested short course of oral prednisone  that I did call in for him today. He also requested that we obtain new cervical MRI imaging.  Patient can follow-up with us  as needed. Could also look at regrouping with physical therapy for dry needling treatments.  His exam today is nonfocal, good strength noted to bilateral upper extremities.    Meds & Orders:  Meds ordered this encounter  Medications   predniSONE  (DELTASONE ) 50 MG tablet    Sig: Take 1 tablet (50 mg total) by mouth daily with breakfast. Take until completed.    Dispense:  5 tablet    Refill:  0    Orders Placed This Encounter  Procedures   Trigger Point Inj   MR CERVICAL SPINE WO CONTRAST    Follow-up: Return if symptoms worsen or fail to improve.   Procedures: Trigger Point Inj  Date/Time: 08/17/2024 1:46 PM  Performed by: Balthazar Dooly E, NP Authorized by: Trudy Duwaine BRAVO, NP   Consent Given by:  Patient Site marked: the procedure site was marked   Timeout: prior to procedure the correct patient, procedure, and site was verified   Indications:  Muscle spasm and pain Total # of Trigger Points:  3 or more Location: neck and shoulder   Needle Size:  25 G Medications  #1:  5 mL lidocaine  1 %; 40 mg triamcinolone  acetonide 40 MG/ML; 5 mL lidocaine  (PF) 1 % Medications #2:  5 mL lidocaine  1 % Comments: Cervical myofascial trigger point injections performed to left cervical paraspinal region, levator scapulae and trapezius muscles. These injection were difficult for patient as there was quite a bit of myofascial tenderness noted. Needling technique was utilized were tolerated.        Clinical History: No specialty comments available.   He reports that he has been smoking cigars. He has never used smokeless tobacco. No results for input(s): HGBA1C, LABURIC in the last 8760 hours.  Objective:  VS:  HT:    WT:   BMI:     BP:   HR: bpm  TEMP: ( )  RESP:  Physical Exam Vitals and nursing note reviewed.  HENT:     Head: Normocephalic and atraumatic.     Right Ear: External ear normal.     Left Ear: External ear normal.     Nose: Nose normal.     Mouth/Throat:     Mouth: Mucous membranes are moist.  Eyes:     Extraocular Movements: Extraocular movements intact.  Cardiovascular:     Rate and Rhythm: Normal rate.     Pulses: Normal pulses.  Pulmonary:     Effort: Pulmonary effort is normal.  Abdominal:     General: Abdomen is flat. There is no distension.  Musculoskeletal:        General: Tenderness present.     Cervical back: Tenderness present.     Comments: No discomfort noted with flexion, extension and side-to-side rotation. Patient has good strength in the upper extremities including 5 out of 5 strength in wrist extension, long finger flexion and APB. Shoulder range of motion is full bilaterally without any sign of impingement. There is no atrophy of the hands intrinsically. Myofascial tenderness noted to left levator scapulae and trapezius region upon palpation. Sensation intact bilaterally. Negative Hoffman's sign. Negative Spurling's sign.     Skin:    General: Skin is warm and dry.     Capillary Refill: Capillary refill takes less than  2 seconds.  Neurological:     General: No focal deficit present.     Mental Status: He is alert and oriented to person, place, and time.  Psychiatric:        Mood and Affect: Mood normal.        Behavior: Behavior normal.     Ortho Exam  Imaging: No results found.  Past Medical/Family/Surgical/Social History: Medications & Allergies reviewed per EMR, new medications updated. 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/2022   Cervical spondylosis without myelopathy 07/11/2022   Lumbar radiculopathy 01/02/2022   Cervical radiculopathy 12/04/2021   Facial pain    Facial swelling 05/18/2021  Complex regional pain syndrome of upper extremity 04/20/2021   Atypical facial pain 04/20/2021   Neck pain on left side 11/01/2020   Pain in joint of left shoulder 07/07/2020   Strain of left trapezius muscle 07/07/2020   Cervicalgia 07/07/2020   Sialolithiasis of submandibular gland 08/04/2019   Neck mass 08/04/2019   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  needs to schedule an appointment with dr. 06/05/2021   Neck pain    able to move his neck with out any problems. 06/05/2021   Swollen gland    swells intermittently - neck   Family History  Problem Relation Age of Onset   Irritable bowel syndrome Sister    Diabetes Sister    Hyperlipidemia Sister    Hypertension Sister    Hypertension Mother    Hypertension Father    Hyperlipidemia Father    Past Surgical  History:  Procedure Laterality Date   boil     EXCISION ORAL LESION WITH CO2 LASER Left 01/07/2023   Procedure: ORAL CAVITY BIOPSY WITH CO2 LASER EXCISION OF LEUKOPLAKIC;  Surgeon: Jesus Oliphant, MD;  Location: [facility] OR;  Service: ENT;  Laterality: Left;   LASER ABLATION CONDOLAMATA N/A 06/08/2021   Procedure: LASER ABLATION CONDOLAMATA;  Surgeon: Debby Hila, MD;  Location: [facility] [place];  Service: General;  Laterality: N/A;   no past surgery     RECTAL EXAM UNDER ANESTHESIA N/A 06/08/2021   Procedure: RECTAL EXAM UNDER ANESTHESIA;  Surgeon: Debby Hila, MD;  Location: [facility];  Service: General;  Laterality: N/A;   SKIN LESION EXCISION     30 yrs ago had boil removed from his butt. 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

## 2024-08-17 NOTE — Progress Notes (Unsigned)
 Pain Scale   Average Pain 7 Patient advising he has neck pain radiating to left side aond pain is cosntant        +Driver, -BT, -Dye Allergies.

## 2024-08-18 MED ORDER — LIDOCAINE HCL 1 % IJ SOLN
5.0000 mL | INTRAMUSCULAR | Status: AC | PRN
Start: 2024-08-17 — End: 2024-08-17
  Administered 2024-08-17: 5 mL

## 2024-08-18 MED ORDER — LIDOCAINE HCL (PF) 1 % IJ SOLN
5.0000 mL | INTRAMUSCULAR | Status: AC | PRN
Start: 2024-08-17 — End: 2024-08-17
  Administered 2024-08-17: 5 mL

## 2024-08-18 MED ORDER — TRIAMCINOLONE ACETONIDE 40 MG/ML IJ SUSP
40.0000 mg | INTRAMUSCULAR | Status: AC | PRN
Start: 2024-08-17 — End: 2024-08-17
  Administered 2024-08-17: 40 mg via INTRAMUSCULAR

## 2024-08-26 ENCOUNTER — Encounter: Payer: Self-pay | Admitting: Physical Medicine and Rehabilitation

## 2024-09-02 DIAGNOSIS — A630 Anogenital (venereal) warts: Secondary | ICD-10-CM | POA: Diagnosis not present

## 2024-09-02 DIAGNOSIS — K6282 Dysplasia of anus: Secondary | ICD-10-CM | POA: Diagnosis not present

## 2024-09-02 DIAGNOSIS — S31831A Laceration without foreign body of anus, initial encounter: Secondary | ICD-10-CM | POA: Diagnosis not present

## 2024-09-07 DIAGNOSIS — Z111 Encounter for screening for respiratory tuberculosis: Secondary | ICD-10-CM | POA: Diagnosis not present

## 2024-09-07 DIAGNOSIS — I10 Essential (primary) hypertension: Secondary | ICD-10-CM | POA: Diagnosis not present

## 2024-09-08 ENCOUNTER — Other Ambulatory Visit

## 2024-10-07 ENCOUNTER — Encounter: Payer: Self-pay | Admitting: Physical Medicine and Rehabilitation

## 2024-10-08 ENCOUNTER — Other Ambulatory Visit

## 2024-10-20 ENCOUNTER — Ambulatory Visit
Admission: EM | Admit: 2024-10-20 | Discharge: 2024-10-20 | Disposition: A | Discharge status: Home / Self Care | Attending: Physician Assistant | Admitting: Physician Assistant

## 2024-10-20 ENCOUNTER — Encounter: Payer: Self-pay | Admitting: Emergency Medicine

## 2024-10-20 DIAGNOSIS — M542 Cervicalgia: Secondary | ICD-10-CM | POA: Diagnosis not present

## 2024-10-20 MED ORDER — PREDNISONE 50 MG PO TABS: ORAL_TABLET | ORAL | 0 refills | Status: AC

## 2024-10-20 MED ORDER — METHOCARBAMOL 500 MG PO TABS: 500.0000 mg | ORAL_TABLET | Freq: Four times a day (QID) | ORAL | 0 refills | Status: AC

## 2024-10-20 MED ORDER — KETOROLAC TROMETHAMINE 60 MG/2ML IM SOLN
30.0000 mg | Freq: Once | INTRAMUSCULAR | Status: AC
  Administered 2024-10-20: 30 mg via INTRAMUSCULAR

## 2024-10-20 NOTE — ED Provider Notes (Signed)
 " [name]    CSN: [id number] Arrival date & time: 10/20/24  1342      History   Chief Complaint Chief Complaint  Patient presents with   Neck Pain    HPI Ricardo [name] is a 44 y.o. male.   Patient complains of pain in his neck.  Patient has had neck pain for an extended period of time he is requesting a shot of Toradol  and a prescription for a muscle relaxer.  Patient states he normally responds very well to prednisone  and would like prednisone .  Patient does have a provider that he sees.  Patient denies any new injury  The history is provided by the patient. No language interpreter was used.  Neck Pain   Past Medical History:  Diagnosis Date   Facial pain    able to eat and swallow.06/05/2021   GERD (gastroesophageal reflux disease)    Hypertension    pt has run out of high blood pressure and is not taking anything for his hypertension.  needs to schedule an appointment with dr. 06/05/2021   Neck pain    able to move his neck with out any problems. 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   Cervical spondylosis without myelopathy 07/11/2022   Lumbar radiculopathy 01/02/2022   Cervical radiculopathy 12/04/2021   Facial pain    Facial swelling 05/18/2021   Complex regional pain syndrome of upper extremity 04/20/2021   Atypical facial pain 04/20/2021   Neck pain on left side 11/01/2020   Pain in joint of left shoulder 07/07/2020   Strain of left trapezius muscle 07/07/2020   Cervicalgia 07/07/2020   Sialolithiasis of submandibular gland 08/04/2019   Neck mass 08/04/2019   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   Irritable bowel  syndrome 07/09/2012    Past Surgical History:  Procedure Laterality Date   boil     EXCISION ORAL LESION WITH CO2 LASER Left 01/07/2023   Procedure: ORAL CAVITY BIOPSY WITH CO2 LASER EXCISION OF LEUKOPLAKIC;  Surgeon: Jesus Oliphant, MD;  Location: [facility] OR;  Service: ENT;  Laterality: Left;   LASER ABLATION CONDOLAMATA N/A 06/08/2021   Procedure: LASER ABLATION CONDOLAMATA;  Surgeon: Debby Hila, MD;  Location: [facility] [place];  Service: General;  Laterality: N/A;   no past surgery     RECTAL EXAM UNDER ANESTHESIA N/A 06/08/2021   Procedure: RECTAL EXAM UNDER ANESTHESIA;  Surgeon: Debby Hila, MD;  Location: [facility] [place];  Service: General;  Laterality: N/A;   SKIN LESION EXCISION     30 yrs ago had boil removed from his butt. 06/05/2021   SUBMANDIBULAR GLAND EXCISION Right 01/07/2023   Procedure: EXCISION SUBMANDIBULAR GLAND;  Surgeon: Jesus Oliphant, MD;  Location: [facility] OR;  Service: ENT;  Laterality: Right;       Home Medications    Prior to Admission medications  Medication Sig Start Date End Date Taking? Authorizing Provider  methocarbamol  (ROBAXIN ) 500 MG tablet Take 1 tablet (500 mg total) by mouth 4 (four) times daily. 10/20/24  Yes Recie Cirrincione K, PA-C  predniSONE  (DELTASONE ) 50 MG tablet One tablet a day 10/20/24  Yes Jameshia Hayashida K,  PA-C  amLODipine  (NORVASC ) 5 MG tablet Take 10 mg by mouth daily.    [provider]  famotidine  (PEPCID ) 20 MG tablet Take 1 tablet (20 mg total) by mouth 2 (two) times daily. 05/04/24   Van Knee, MD  ibuprofen  (ADVIL ) 800 MG tablet Take 1 tablet (800 mg total) by mouth 3 (three) times daily. 05/26/24   Rising, Rebecca, PA-C  imiquimod (ALDARA) 5 % cream Apply topically. Patient not taking: Reported on 05/04/2024 08/07/23   [provider]  lidocaine  (XYLOCAINE ) 2 % solution Use as directed 15 mLs in the mouth or throat every 3 (three) hours as needed for mouth pain. Swish/gargle and spit out 05/26/24    Rising, Asberry, PA-C  pantoprazole  (PROTONIX ) 20 MG tablet Take 1 tablet (20 mg total) by mouth daily. 05/04/24   Van Knee, MD    Family History Family History  Problem Relation Age of Onset   Irritable bowel syndrome Sister    Diabetes Sister    Hyperlipidemia Sister    Hypertension Sister    Hypertension Mother    Hypertension Father    Hyperlipidemia Father     Social History Social History[1]   Allergies   Latex   Review of Systems Review of Systems  Musculoskeletal:  Positive for neck pain.  All other systems reviewed and are negative.    Physical Exam Triage Vital Signs ED Triage Vitals  Encounter Vitals Group     BP 10/20/24 1509 (!) 150/109     Girls Systolic BP Percentile --      Girls Diastolic BP Percentile --      Boys Systolic BP Percentile --      Boys Diastolic BP Percentile --      Pulse Rate 10/20/24 1509 81     Resp 10/20/24 1509 18     Temp 10/20/24 1509 98.4 F (36.9 C)     Temp Source 10/20/24 1509 Temporal     SpO2 10/20/24 1509 94 %     Weight --      Height --      Head Circumference --      Peak Flow --      Pain Score 10/20/24 1508 10     Pain Loc --      Pain Education --      Exclude from Growth Chart --    No data found.  Updated Vital Signs BP (!) 150/109 (BP Location: Left Arm)   Pulse 81   Temp 98.4 F (36.9 C) (Temporal)   Resp 18   SpO2 94%   Visual Acuity Right Eye Distance:   Left Eye Distance:   Bilateral Distance:    Right Eye Near:   Left Eye Near:    Bilateral Near:     Physical Exam Vitals and nursing note reviewed.  Constitutional:      Appearance: He is well-developed.  HENT:     Head: Normocephalic.  Neck:     Comments: Tender trapezius muscles and Cardiovascular:     Rate and Rhythm: Normal rate.  Pulmonary:     Effort: Pulmonary effort is normal.  Abdominal:     General: There is no distension.  Musculoskeletal:        General: Normal range of motion.     Cervical back:  Normal range of motion. Tenderness present.  Skin:    General: Skin is warm.  Neurological:     General: No focal deficit present.     Mental Status: He  is alert and oriented to person, place, and time.      UC Treatments / Results  Labs (all labs ordered are listed, but only abnormal results are displayed) Labs Reviewed - No data to display  EKG   Radiology No results found.  Procedures Procedures (including critical care time)  Medications Ordered in UC Medications  ketorolac  (TORADOL ) injection 30 mg (30 mg Intramuscular Given 10/20/24 1604)    Initial Impression / Assessment and Plan / UC Course  I have reviewed the triage vital signs and the nursing notes.  Pertinent labs & imaging results that were available during my care of the patient were reviewed by me and considered in my medical decision making (see chart for details).     Patient is given an injection of Toradol  a prescription for prednisone  and Robaxin  he is advised to follow-up with his primary care physician Final Clinical Impressions(s) / UC Diagnoses   Final diagnoses:  Neck pain     Discharge Instructions      Return if any problems.     ED Prescriptions     Medication Sig Dispense Auth. Provider   predniSONE  (DELTASONE ) 50 MG tablet One tablet a day 5 tablet Miles Leyda K, PA-C   methocarbamol  (ROBAXIN ) 500 MG tablet Take 1 tablet (500 mg total) by mouth 4 (four) times daily. 20 tablet Shalinda Burkholder K, PA-C      An After Visit Summary was printed and given to the patient.     PDMP not reviewed this encounter.    [1]  Social History Tobacco Use   Smoking status: Every Day    Types: Cigars   Smokeless tobacco: Never  Vaping Use   Vaping status: Never Used  Substance Use Topics   Alcohol use: Yes    Alcohol/week: 7.0 standard drinks of alcohol    Types: 3 Cans of beer, 4 Shots of liquor per week   Drug use: Yes    Frequency: 4.0 times per week    Types: Marijuana      [place] Sonny POUR, PA-C 10/20/24 1811  "

## 2024-10-20 NOTE — ED Triage Notes (Signed)
 Patient presents for neck pain x 1.5 week.  Patient denies injury thinks its just from work.  Patient has been taken ibphofen and a muscle relaxer.

## 2024-10-20 NOTE — Discharge Instructions (Addendum)
 Return if any problems.

## 2024-11-04 ENCOUNTER — Ambulatory Visit: Admitting: Physical Medicine and Rehabilitation
